# Patient Record
Sex: Male | Born: 2000 | Race: White | Hispanic: No | Marital: Single | Smoking: Never smoker
Health system: Southern US, Community
[De-identification: ages and names within clinical notes are randomized; demographics above are authoritative.]

## PROBLEM LIST (undated history)

## (undated) DIAGNOSIS — J45909 Unspecified asthma, uncomplicated: Secondary | ICD-10-CM

---
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
## 2019-03-19 ENCOUNTER — Encounter (HOSPITAL_COMMUNITY): Payer: Self-pay

## 2019-03-19 ENCOUNTER — Ambulatory Visit (HOSPITAL_COMMUNITY)
Admission: EM | Admit: 2019-03-19 | Discharge: 2019-03-19 | Disposition: A | Discharge status: Home / Self Care | Payer: Self-pay | Attending: Family Medicine | Admitting: Family Medicine

## 2019-03-19 ENCOUNTER — Other Ambulatory Visit: Payer: Self-pay

## 2019-03-19 DIAGNOSIS — H6122 Impacted cerumen, left ear: Secondary | ICD-10-CM

## 2019-03-19 HISTORY — DX: Unspecified asthma, uncomplicated: J45.909

## 2019-03-19 NOTE — ED Triage Notes (Signed)
Pt presents to UC w/ c/o hearing loss in left ear since yesterday. Pt states he feels like "something is blocking it"

## 2019-03-19 NOTE — Discharge Instructions (Signed)
May use over the counter Debrox as needed, limit/avoid use of qtips which can further compress wax within the ear.

## 2019-03-19 NOTE — ED Provider Notes (Signed)
[facility]    CSN: [id number] Arrival date & time: 03/19/19  1532      History   Chief Complaint Chief Complaint  Patient presents with   left ear pain/obstruction    HPI William Knapp is a 18 y.o. male.   William Knapp presents with complaints of left ear fullness, with a "blocked" sensation. He tried putting water and hydrogen peroxide and q-tips to clean, which did not help. Not painful necessarily but sensation of water in it like it needs to pop. No other nasal or facial congestion. No injury to the ear. No specific hearing loss. Denies any previous similar. No drainage from the ear.    ROS per HPI, negative if not otherwise mentioned.      Past Medical History:  Diagnosis Date   Asthma     There are no active problems to display for this patient.   History reviewed. No pertinent surgical history.     Home Medications    Prior to Admission medications   Not on File    Family History Family History  Problem Relation Age of Onset   Healthy Mother    Healthy Father     Social History Social History   Tobacco Use   Smoking status: Never Smoker   Smokeless tobacco: Never Used  Substance Use Topics   Alcohol use: Never    Frequency: Never   Drug use: Yes    Types: Marijuana     Allergies   Patient has no known allergies.   Review of Systems Review of Systems   Physical Exam Triage Vital Signs ED Triage Vitals  Enc Vitals Group     BP 03/19/19 1657 (!) 143/77     Pulse Rate 03/19/19 1657 68     Resp 03/19/19 1657 16     Temp 03/19/19 1657 97.8 F (36.6 C)     Temp Source 03/19/19 1657 Oral     SpO2 03/19/19 1657 100 %     Weight --      Height --      Head Circumference --      Peak Flow --      Pain Score 03/19/19 1700 0     Pain Loc --      Pain Edu? --      Excl. in [place]? --    No data found.  Updated Vital Signs BP (!) 143/77 (BP Location: Right Arm)    Pulse 68    Temp 97.8 F (36.6 C) (Oral)     Resp 16    SpO2 100%    Physical Exam Constitutional:      Appearance: He is well-developed.  HENT:     Right Ear: Tympanic membrane and ear canal normal.     Left Ear: Tympanic membrane and ear canal normal. There is impacted cerumen.     Ears:     Comments: Cerumen impaction present on initial evaluation; irrigated per nursing staff, on re-evaluation no acute findings and patient with subjective improvement.  Cardiovascular:     Rate and Rhythm: Normal rate.  Pulmonary:     Effort: Pulmonary effort is normal.  Skin:    General: Skin is warm and dry.  Neurological:     Mental Status: He is alert and oriented to person, place, and time.      UC Treatments / Results  Labs (all labs ordered are listed, but only abnormal results are displayed) Labs Reviewed - No data to  display  EKG   Radiology No results found.  Procedures Procedures (including critical care time)  Medications Ordered in UC Medications - No data to display  Initial Impression / Assessment and Plan / UC Course  I have reviewed the triage vital signs and the nursing notes.  Pertinent labs & imaging results that were available during my care of the patient were reviewed by me and considered in my medical decision making (see chart for details).     Irrigation to left ear canal per nursing staff with successful removal of cerumen. No other acute findings and patient feels improved. Cerumen management discussed. Patient verbalized understanding and agreeable to plan.   Final Clinical Impressions(s) / UC Diagnoses   Final diagnoses:  Impacted cerumen of left ear     Discharge Instructions     May use over the counter Debrox as needed, limit/avoid use of qtips which can further compress wax within the ear.     ED Prescriptions    None     PDMP not reviewed this encounter.   Georgetta Haber, NP 03/19/19 1824

## 2020-08-17 ENCOUNTER — Emergency Department (HOSPITAL_COMMUNITY): Payer: No Typology Code available for payment source

## 2020-08-17 ENCOUNTER — Other Ambulatory Visit: Payer: Self-pay

## 2020-08-17 ENCOUNTER — Inpatient Hospital Stay (HOSPITAL_COMMUNITY): Payer: No Typology Code available for payment source | Admitting: Certified Registered Nurse Anesthetist

## 2020-08-17 ENCOUNTER — Encounter (HOSPITAL_COMMUNITY): Payer: Self-pay

## 2020-08-17 ENCOUNTER — Inpatient Hospital Stay (HOSPITAL_COMMUNITY): Payer: No Typology Code available for payment source

## 2020-08-17 ENCOUNTER — Inpatient Hospital Stay (HOSPITAL_COMMUNITY)
Admission: EM | Admit: 2020-08-17 | Discharge: 2020-08-21 | DRG: 958 | Disposition: A | Discharge status: Rehabilitation Facility | Payer: No Typology Code available for payment source | Attending: General Surgery | Admitting: General Surgery

## 2020-08-17 ENCOUNTER — Encounter (HOSPITAL_COMMUNITY): Admission: EM | Disposition: A | Discharge status: Rehabilitation Facility | Payer: Self-pay | Source: Home / Self Care

## 2020-08-17 DIAGNOSIS — K449 Diaphragmatic hernia without obstruction or gangrene: Secondary | ICD-10-CM

## 2020-08-17 DIAGNOSIS — D62 Acute posthemorrhagic anemia: Secondary | ICD-10-CM | POA: Diagnosis not present

## 2020-08-17 DIAGNOSIS — S32810A Multiple fractures of pelvis with stable disruption of pelvic ring, initial encounter for closed fracture: Secondary | ICD-10-CM | POA: Diagnosis present

## 2020-08-17 DIAGNOSIS — Z20822 Contact with and (suspected) exposure to covid-19: Secondary | ICD-10-CM | POA: Diagnosis present

## 2020-08-17 DIAGNOSIS — S272XXA Traumatic hemopneumothorax, initial encounter: Secondary | ICD-10-CM | POA: Diagnosis present

## 2020-08-17 DIAGNOSIS — D696 Thrombocytopenia, unspecified: Secondary | ICD-10-CM | POA: Diagnosis not present

## 2020-08-17 DIAGNOSIS — J939 Pneumothorax, unspecified: Secondary | ICD-10-CM

## 2020-08-17 DIAGNOSIS — S36112A Contusion of liver, initial encounter: Secondary | ICD-10-CM | POA: Diagnosis present

## 2020-08-17 DIAGNOSIS — S27809A Unspecified injury of diaphragm, initial encounter: Secondary | ICD-10-CM | POA: Diagnosis present

## 2020-08-17 DIAGNOSIS — Z9109 Other allergy status, other than to drugs and biological substances: Secondary | ICD-10-CM | POA: Diagnosis not present

## 2020-08-17 DIAGNOSIS — S2242XA Multiple fractures of ribs, left side, initial encounter for closed fracture: Secondary | ICD-10-CM | POA: Diagnosis present

## 2020-08-17 DIAGNOSIS — S32119A Unspecified Zone I fracture of sacrum, initial encounter for closed fracture: Secondary | ICD-10-CM | POA: Diagnosis present

## 2020-08-17 DIAGNOSIS — J942 Hemothorax: Secondary | ICD-10-CM

## 2020-08-17 DIAGNOSIS — Y92410 Unspecified street and highway as the place of occurrence of the external cause: Secondary | ICD-10-CM

## 2020-08-17 DIAGNOSIS — R109 Unspecified abdominal pain: Secondary | ICD-10-CM | POA: Diagnosis not present

## 2020-08-17 DIAGNOSIS — K458 Other specified abdominal hernia without obstruction or gangrene: Secondary | ICD-10-CM | POA: Diagnosis present

## 2020-08-17 DIAGNOSIS — S270XXA Traumatic pneumothorax, initial encounter: Secondary | ICD-10-CM

## 2020-08-17 DIAGNOSIS — E739 Lactose intolerance, unspecified: Secondary | ICD-10-CM | POA: Diagnosis present

## 2020-08-17 DIAGNOSIS — M25562 Pain in left knee: Secondary | ICD-10-CM

## 2020-08-17 DIAGNOSIS — S3282XA Multiple fractures of pelvis without disruption of pelvic ring, initial encounter for closed fracture: Secondary | ICD-10-CM

## 2020-08-17 DIAGNOSIS — Z4682 Encounter for fitting and adjustment of non-vascular catheter: Secondary | ICD-10-CM

## 2020-08-17 DIAGNOSIS — S329XXA Fracture of unspecified parts of lumbosacral spine and pelvis, initial encounter for closed fracture: Secondary | ICD-10-CM

## 2020-08-17 DIAGNOSIS — T1490XA Injury, unspecified, initial encounter: Secondary | ICD-10-CM

## 2020-08-17 HISTORY — PX: LAPAROSCOPY: SHX197

## 2020-08-17 HISTORY — PX: ORIF PELVIC FRACTURE WITH PERCUTANEOUS SCREWS: SHX6800

## 2020-08-17 LAB — URINALYSIS, ROUTINE W REFLEX MICROSCOPIC
Bacteria, UA: NONE SEEN
Bilirubin Urine: NEGATIVE
Glucose, UA: 50 mg/dL — AB
Ketones, ur: NEGATIVE mg/dL
Nitrite: NEGATIVE
Protein, ur: 100 mg/dL — AB
RBC / HPF: >50 RBC/hpf — ABNORMAL HIGH (ref 0–5)
Specific Gravity, Urine: 1.023 (ref 1.005–1.030)
pH: 7 (ref 5.0–8.0)

## 2020-08-17 LAB — LACTIC ACID, PLASMA
Lactic Acid, Venous: 3.5 mmol/L (ref 0.5–1.9)
Lactic Acid, Venous: 7.5 mmol/L (ref 0.5–1.9)

## 2020-08-17 LAB — I-STAT CHEM 8, ED
BUN: 17 mg/dL (ref 6–20)
Calcium, Ion: 1.1 mmol/L — ABNORMAL LOW (ref 1.15–1.40)
Chloride: 101 mmol/L (ref 98–111)
Creatinine, Ser: 1 mg/dL (ref 0.61–1.24)
Glucose, Bld: 178 mg/dL — ABNORMAL HIGH (ref 70–99)
HCT: 46 % (ref 39.0–52.0)
Hemoglobin: 15.6 g/dL (ref 13.0–17.0)
Potassium: 3.3 mmol/L — ABNORMAL LOW (ref 3.5–5.1)
Sodium: 139 mmol/L (ref 135–145)
TCO2: 24 mmol/L (ref 22–32)

## 2020-08-17 LAB — SAMPLE TO BLOOD BANK

## 2020-08-17 LAB — COMPREHENSIVE METABOLIC PANEL
ALT: 250 U/L — ABNORMAL HIGH (ref 0–44)
AST: 311 U/L — ABNORMAL HIGH (ref 15–41)
Albumin: 3.9 g/dL (ref 3.5–5.0)
Alkaline Phosphatase: 101 U/L (ref 38–126)
Anion gap: 10 (ref 5–15)
BUN: 14 mg/dL (ref 6–20)
CO2: 25 mmol/L (ref 22–32)
Calcium: 8.7 mg/dL — ABNORMAL LOW (ref 8.9–10.3)
Chloride: 102 mmol/L (ref 98–111)
Creatinine, Ser: 1.15 mg/dL (ref 0.61–1.24)
GFR, Estimated: >60 mL/min (ref >60)
Glucose, Bld: 182 mg/dL — ABNORMAL HIGH (ref 70–99)
Potassium: 3.1 mmol/L — ABNORMAL LOW (ref 3.5–5.1)
Sodium: 137 mmol/L (ref 135–145)
Total Bilirubin: 0.8 mg/dL (ref 0.3–1.2)
Total Protein: 7.1 g/dL (ref 6.5–8.1)

## 2020-08-17 LAB — CBC
HCT: 45.9 % (ref 39.0–52.0)
Hemoglobin: 15.3 g/dL (ref 13.0–17.0)
MCH: 28.7 pg (ref 26.0–34.0)
MCHC: 33.3 g/dL (ref 30.0–36.0)
MCV: 86.1 fL (ref 80.0–100.0)
Platelets: 253 10*3/uL (ref 150–400)
RBC: 5.33 MIL/uL (ref 4.22–5.81)
RDW: 14 % (ref 11.5–15.5)
WBC: 19.8 10*3/uL — ABNORMAL HIGH (ref 4.0–10.5)
nRBC: 0 % (ref 0.0–0.2)

## 2020-08-17 LAB — HIV ANTIBODY (ROUTINE TESTING W REFLEX): HIV Screen 4th Generation wRfx: NONREACTIVE

## 2020-08-17 LAB — RESP PANEL BY RT-PCR (FLU A&B, COVID) ARPGX2
Influenza A by PCR: NEGATIVE
Influenza B by PCR: NEGATIVE
SARS Coronavirus 2 by RT PCR: NEGATIVE

## 2020-08-17 LAB — ETHANOL: Alcohol, Ethyl (B): <10 mg/dL (ref <10)

## 2020-08-17 LAB — PROTIME-INR
INR: 1.4 — ABNORMAL HIGH (ref 0.8–1.2)
Prothrombin Time: 16.3 seconds — ABNORMAL HIGH (ref 11.4–15.2)

## 2020-08-17 SURGERY — CLOSED REDUCTION, PELVIS, WITH PERCUTANEOUS FIXATION
Anesthesia: General

## 2020-08-17 MED ORDER — BUPIVACAINE HCL 0.25 % IJ SOLN
INTRAMUSCULAR | Status: DC | PRN
  Administered 2020-08-17: 5 mL

## 2020-08-17 MED ORDER — CHLORHEXIDINE GLUCONATE 0.12 % MT SOLN
15.0000 mL | Freq: Once | OROMUCOSAL | Status: AC
  Administered 2020-08-17: 15 mL via OROMUCOSAL
  Filled 2020-08-17: qty 15

## 2020-08-17 MED ORDER — ONDANSETRON HCL 4 MG/2ML IJ SOLN: 4.0000 mg | Freq: Four times a day (QID) | INTRAMUSCULAR | Status: DC | PRN

## 2020-08-17 MED ORDER — FENTANYL CITRATE (PF) 250 MCG/5ML IJ SOLN
INTRAMUSCULAR | Status: DC | PRN
  Administered 2020-08-17: 25 ug via INTRAVENOUS
  Administered 2020-08-17: 50 ug via INTRAVENOUS
  Administered 2020-08-17: 25 ug via INTRAVENOUS
  Administered 2020-08-17: 100 ug via INTRAVENOUS
  Administered 2020-08-17: 50 ug via INTRAVENOUS

## 2020-08-17 MED ORDER — ONDANSETRON HCL 4 MG/2ML IJ SOLN
INTRAMUSCULAR | Status: AC
  Filled 2020-08-17: qty 4

## 2020-08-17 MED ORDER — DOCUSATE SODIUM 100 MG PO CAPS
100.0000 mg | ORAL_CAPSULE | Freq: Two times a day (BID) | ORAL | Status: DC
  Administered 2020-08-17 – 2020-08-21 (×8): 100 mg via ORAL
  Filled 2020-08-17 (×8): qty 1

## 2020-08-17 MED ORDER — BUPIVACAINE HCL (PF) 0.25 % IJ SOLN
INTRAMUSCULAR | Status: AC
  Filled 2020-08-17: qty 30

## 2020-08-17 MED ORDER — IOHEXOL 300 MG/ML  SOLN
100.0000 mL | Freq: Once | INTRAMUSCULAR | Status: AC | PRN
  Administered 2020-08-17: 100 mL via INTRAVENOUS

## 2020-08-17 MED ORDER — ACETAMINOPHEN 160 MG/5ML PO SOLN: 1000.0000 mg | Freq: Once | ORAL | Status: DC | PRN

## 2020-08-17 MED ORDER — DEXAMETHASONE SODIUM PHOSPHATE 10 MG/ML IJ SOLN
INTRAMUSCULAR | Status: AC
  Filled 2020-08-17: qty 2

## 2020-08-17 MED ORDER — ONDANSETRON HCL 4 MG/2ML IJ SOLN
INTRAMUSCULAR | Status: DC | PRN
  Administered 2020-08-17: 4 mg via INTRAVENOUS

## 2020-08-17 MED ORDER — LACTATED RINGERS IV SOLN: INTRAVENOUS | Status: DC

## 2020-08-17 MED ORDER — SUGAMMADEX SODIUM 200 MG/2ML IV SOLN
INTRAVENOUS | Status: DC | PRN
  Administered 2020-08-17: 200 mg via INTRAVENOUS

## 2020-08-17 MED ORDER — ONDANSETRON HCL 4 MG PO TABS: 4.0000 mg | ORAL_TABLET | Freq: Four times a day (QID) | ORAL | Status: DC | PRN

## 2020-08-17 MED ORDER — KETAMINE HCL 50 MG/5ML IJ SOSY
15.0000 mg | PREFILLED_SYRINGE | Freq: Once | INTRAMUSCULAR | Status: AC
  Administered 2020-08-17: 15 mg via INTRAVENOUS

## 2020-08-17 MED ORDER — DEXAMETHASONE SODIUM PHOSPHATE 10 MG/ML IJ SOLN
INTRAMUSCULAR | Status: DC | PRN
  Administered 2020-08-17: 10 mg via INTRAVENOUS

## 2020-08-17 MED ORDER — ACETAMINOPHEN 10 MG/ML IV SOLN: 1000.0000 mg | Freq: Once | INTRAVENOUS | Status: DC | PRN

## 2020-08-17 MED ORDER — ENOXAPARIN SODIUM 30 MG/0.3ML ~~LOC~~ SOLN: 30.0000 mg | Freq: Two times a day (BID) | SUBCUTANEOUS | Status: DC

## 2020-08-17 MED ORDER — ROCURONIUM BROMIDE 10 MG/ML (PF) SYRINGE
PREFILLED_SYRINGE | INTRAVENOUS | Status: DC | PRN
  Administered 2020-08-17 (×2): 50 mg via INTRAVENOUS

## 2020-08-17 MED ORDER — ACETAMINOPHEN 325 MG PO TABS
650.0000 mg | ORAL_TABLET | Freq: Four times a day (QID) | ORAL | Status: DC
  Administered 2020-08-18 (×2): 650 mg via ORAL
  Filled 2020-08-17 (×2): qty 2

## 2020-08-17 MED ORDER — PHENYLEPHRINE HCL (PRESSORS) 10 MG/ML IV SOLN
INTRAVENOUS | Status: DC | PRN
  Administered 2020-08-17 (×2): 120 ug via INTRAVENOUS

## 2020-08-17 MED ORDER — ONDANSETRON 4 MG PO TBDP: 4.0000 mg | ORAL_TABLET | Freq: Four times a day (QID) | ORAL | Status: DC | PRN

## 2020-08-17 MED ORDER — METOCLOPRAMIDE HCL 5 MG/ML IJ SOLN: 5.0000 mg | Freq: Three times a day (TID) | INTRAMUSCULAR | Status: DC | PRN

## 2020-08-17 MED ORDER — SODIUM CHLORIDE 0.9 % IV SOLN: INTRAVENOUS | Status: DC

## 2020-08-17 MED ORDER — LIDOCAINE 2% (20 MG/ML) 5 ML SYRINGE
INTRAMUSCULAR | Status: AC
  Filled 2020-08-17: qty 10

## 2020-08-17 MED ORDER — EPHEDRINE 5 MG/ML INJ
INTRAVENOUS | Status: AC
  Filled 2020-08-17: qty 10

## 2020-08-17 MED ORDER — PHENYLEPHRINE 40 MCG/ML (10ML) SYRINGE FOR IV PUSH (FOR BLOOD PRESSURE SUPPORT)
PREFILLED_SYRINGE | INTRAVENOUS | Status: AC
  Filled 2020-08-17: qty 20

## 2020-08-17 MED ORDER — SUCCINYLCHOLINE CHLORIDE 200 MG/10ML IV SOSY
PREFILLED_SYRINGE | INTRAVENOUS | Status: AC
  Filled 2020-08-17: qty 20

## 2020-08-17 MED ORDER — HYDROMORPHONE HCL 1 MG/ML IJ SOLN
INTRAMUSCULAR | Status: AC
  Filled 2020-08-17: qty 0.5

## 2020-08-17 MED ORDER — DOCUSATE SODIUM 100 MG PO CAPS: 100.0000 mg | ORAL_CAPSULE | Freq: Two times a day (BID) | ORAL | Status: DC

## 2020-08-17 MED ORDER — CEFAZOLIN SODIUM-DEXTROSE 2-4 GM/100ML-% IV SOLN
2.0000 g | Freq: Three times a day (TID) | INTRAVENOUS | Status: AC
  Administered 2020-08-17 – 2020-08-18 (×3): 2 g via INTRAVENOUS
  Filled 2020-08-17 (×3): qty 100

## 2020-08-17 MED ORDER — OXYCODONE HCL 5 MG/5ML PO SOLN
5.0000 mg | Freq: Once | ORAL | Status: DC | PRN
Start: 2020-08-17 — End: 2020-08-17

## 2020-08-17 MED ORDER — LACTATED RINGERS IV SOLN
INTRAVENOUS | Status: DC
Start: 2020-08-17 — End: 2020-08-19

## 2020-08-17 MED ORDER — ACETAMINOPHEN 10 MG/ML IV SOLN
INTRAVENOUS | Status: DC | PRN
  Administered 2020-08-17: 1000 mg via INTRAVENOUS

## 2020-08-17 MED ORDER — LIDOCAINE 2% (20 MG/ML) 5 ML SYRINGE
INTRAMUSCULAR | Status: DC | PRN
  Administered 2020-08-17: 60 mg via INTRAVENOUS

## 2020-08-17 MED ORDER — DEXMEDETOMIDINE HCL 200 MCG/2ML IV SOLN
INTRAVENOUS | Status: DC | PRN
  Administered 2020-08-17 (×2): 8 ug via INTRAVENOUS

## 2020-08-17 MED ORDER — PANTOPRAZOLE SODIUM 40 MG IV SOLR
40.0000 mg | Freq: Every day | INTRAVENOUS | Status: DC
  Administered 2020-08-17: 40 mg via INTRAVENOUS
  Filled 2020-08-17 (×2): qty 40

## 2020-08-17 MED ORDER — CEFAZOLIN SODIUM-DEXTROSE 2-3 GM-%(50ML) IV SOLR
INTRAVENOUS | Status: DC | PRN
  Administered 2020-08-17: 2 g via INTRAVENOUS

## 2020-08-17 MED ORDER — CHLORHEXIDINE GLUCONATE 0.12 % MT SOLN
15.0000 mL | Freq: Once | OROMUCOSAL | Status: AC
  Filled 2020-08-17: qty 15

## 2020-08-17 MED ORDER — CHLORHEXIDINE GLUCONATE 0.12 % MT SOLN
OROMUCOSAL | Status: AC
  Administered 2020-08-17: 15 mL via OROMUCOSAL
  Filled 2020-08-17: qty 15

## 2020-08-17 MED ORDER — PHENYLEPHRINE HCL-NACL 10-0.9 MG/250ML-% IV SOLN
INTRAVENOUS | Status: DC | PRN
  Administered 2020-08-17: 50 ug/min via INTRAVENOUS

## 2020-08-17 MED ORDER — SUCCINYLCHOLINE CHLORIDE 200 MG/10ML IV SOSY
PREFILLED_SYRINGE | INTRAVENOUS | Status: DC | PRN
  Administered 2020-08-17: 100 mg via INTRAVENOUS

## 2020-08-17 MED ORDER — METOPROLOL TARTRATE 5 MG/5ML IV SOLN: 5.0000 mg | Freq: Four times a day (QID) | INTRAVENOUS | Status: DC | PRN

## 2020-08-17 MED ORDER — KETAMINE HCL 50 MG/5ML IJ SOSY
0.3000 mg/kg | PREFILLED_SYRINGE | Freq: Once | INTRAMUSCULAR | Status: AC
  Administered 2020-08-17: 15 mg via INTRAVENOUS

## 2020-08-17 MED ORDER — LACTATED RINGERS IV SOLN: INTRAVENOUS | Status: DC | PRN

## 2020-08-17 MED ORDER — OXYCODONE HCL 5 MG PO TABS
5.0000 mg | ORAL_TABLET | ORAL | Status: DC | PRN
  Administered 2020-08-19 – 2020-08-21 (×4): 5 mg via ORAL
  Filled 2020-08-17: qty 2
  Filled 2020-08-17 (×3): qty 1

## 2020-08-17 MED ORDER — OXYCODONE HCL 5 MG PO TABS
5.0000 mg | ORAL_TABLET | ORAL | Status: DC | PRN
Start: 2020-08-17 — End: 2020-08-17

## 2020-08-17 MED ORDER — LIDOCAINE 5 % EX PTCH
1.0000 | MEDICATED_PATCH | CUTANEOUS | Status: DC
  Administered 2020-08-18 – 2020-08-21 (×4): 1 via TRANSDERMAL
  Filled 2020-08-17 (×5): qty 1

## 2020-08-17 MED ORDER — METOCLOPRAMIDE HCL 10 MG PO TABS: 5.0000 mg | ORAL_TABLET | Freq: Three times a day (TID) | ORAL | Status: DC | PRN

## 2020-08-17 MED ORDER — MIDAZOLAM HCL 2 MG/2ML IJ SOLN
INTRAMUSCULAR | Status: AC
  Filled 2020-08-17: qty 2

## 2020-08-17 MED ORDER — HYDROMORPHONE HCL 1 MG/ML IJ SOLN
INTRAMUSCULAR | Status: DC | PRN
  Administered 2020-08-17: .5 mg via INTRAVENOUS

## 2020-08-17 MED ORDER — FENTANYL CITRATE (PF) 100 MCG/2ML IJ SOLN: 25.0000 ug | INTRAMUSCULAR | Status: DC | PRN

## 2020-08-17 MED ORDER — MORPHINE SULFATE (PF) 2 MG/ML IV SOLN
2.0000 mg | INTRAVENOUS | Status: DC | PRN
  Administered 2020-08-17: 4 mg via INTRAVENOUS
  Filled 2020-08-17 (×2): qty 2

## 2020-08-17 MED ORDER — ACETAMINOPHEN 500 MG PO TABS: 1000.0000 mg | ORAL_TABLET | Freq: Once | ORAL | Status: DC | PRN

## 2020-08-17 MED ORDER — METHOCARBAMOL 500 MG PO TABS: 500.0000 mg | ORAL_TABLET | Freq: Three times a day (TID) | ORAL | Status: DC

## 2020-08-17 MED ORDER — 0.9 % SODIUM CHLORIDE (POUR BTL) OPTIME
TOPICAL | Status: DC | PRN
  Administered 2020-08-17: 1000 mL

## 2020-08-17 MED ORDER — FENTANYL CITRATE (PF) 250 MCG/5ML IJ SOLN
INTRAMUSCULAR | Status: AC
  Filled 2020-08-17: qty 5

## 2020-08-17 MED ORDER — MIDAZOLAM HCL 2 MG/2ML IJ SOLN
INTRAMUSCULAR | Status: DC | PRN
  Administered 2020-08-17: 2 mg via INTRAVENOUS

## 2020-08-17 MED ORDER — SODIUM CHLORIDE 0.9 % IV BOLUS
1000.0000 mL | Freq: Once | INTRAVENOUS | Status: AC
  Administered 2020-08-17: 1000 mL via INTRAVENOUS

## 2020-08-17 MED ORDER — HYDROMORPHONE HCL 1 MG/ML IJ SOLN
0.5000 mg | INTRAMUSCULAR | Status: DC | PRN
  Administered 2020-08-18: 1 mg via INTRAVENOUS
  Filled 2020-08-17: qty 1

## 2020-08-17 MED ORDER — KETAMINE HCL 50 MG/5ML IJ SOSY
PREFILLED_SYRINGE | INTRAMUSCULAR | Status: AC
  Filled 2020-08-17: qty 5

## 2020-08-17 MED ORDER — KETAMINE HCL 10 MG/ML IJ SOLN
0.3000 mg/kg | Freq: Once | INTRAMUSCULAR | Status: DC
  Administered 2020-08-17: 15 mg via INTRAVENOUS

## 2020-08-17 MED ORDER — ALBUMIN HUMAN 5 % IV SOLN: INTRAVENOUS | Status: DC | PRN

## 2020-08-17 MED ORDER — OXYCODONE HCL 5 MG PO TABS: 5.0000 mg | ORAL_TABLET | Freq: Once | ORAL | Status: DC | PRN

## 2020-08-17 MED ORDER — METHOCARBAMOL 1000 MG/10ML IJ SOLN
500.0000 mg | Freq: Four times a day (QID) | INTRAVENOUS | Status: DC | PRN
  Filled 2020-08-17: qty 5

## 2020-08-17 MED ORDER — ACETAMINOPHEN 325 MG PO TABS: 650.0000 mg | ORAL_TABLET | ORAL | Status: DC | PRN

## 2020-08-17 MED ORDER — CEFAZOLIN SODIUM-DEXTROSE 2-4 GM/100ML-% IV SOLN
INTRAVENOUS | Status: AC
  Administered 2020-08-17: 2000 mg
  Filled 2020-08-17: qty 100

## 2020-08-17 MED ORDER — VANCOMYCIN HCL 1000 MG IV SOLR
INTRAVENOUS | Status: AC
  Filled 2020-08-17: qty 1000

## 2020-08-17 MED ORDER — PROPOFOL 10 MG/ML IV BOLUS
INTRAVENOUS | Status: AC
  Filled 2020-08-17: qty 20

## 2020-08-17 MED ORDER — PROPOFOL 10 MG/ML IV BOLUS
INTRAVENOUS | Status: DC | PRN
  Administered 2020-08-17: 60 mg via INTRAVENOUS

## 2020-08-17 MED ORDER — METHOCARBAMOL 500 MG PO TABS
500.0000 mg | ORAL_TABLET | Freq: Four times a day (QID) | ORAL | Status: DC | PRN
  Administered 2020-08-17: 500 mg via ORAL
  Filled 2020-08-17: qty 1

## 2020-08-17 MED ORDER — VASOPRESSIN 20 UNIT/ML IV SOLN
INTRAVENOUS | Status: AC
  Filled 2020-08-17: qty 1

## 2020-08-17 MED ORDER — POLYETHYLENE GLYCOL 3350 17 G PO PACK: 17.0000 g | PACK | Freq: Every day | ORAL | Status: DC | PRN

## 2020-08-17 MED ORDER — PANTOPRAZOLE SODIUM 40 MG PO TBEC
40.0000 mg | DELAYED_RELEASE_TABLET | Freq: Every day | ORAL | Status: DC
  Administered 2020-08-18 – 2020-08-21 (×4): 40 mg via ORAL
  Filled 2020-08-17 (×4): qty 1

## 2020-08-17 SURGICAL SUPPLY — 80 items
ADH SKN CLS APL DERMABOND .7 (GAUZE/BANDAGES/DRESSINGS) ×4
APL PRP STRL LF DISP 70% ISPRP (MISCELLANEOUS) ×4
BAG DECANTER FOR FLEXI CONT (MISCELLANEOUS) ×3 IMPLANT
BIOPATCH RED 1 DISK 7.0 (GAUZE/BANDAGES/DRESSINGS) ×3 IMPLANT
BIT DRILL CANN 4.5MM (BIT) ×2 IMPLANT
BLADE CLIPPER SURG (BLADE) ×3 IMPLANT
BLADE SURG 11 STRL SS (BLADE) ×3 IMPLANT
CANISTER SUCT 3000ML PPV (MISCELLANEOUS) ×6 IMPLANT
CHLORAPREP W/TINT 26 (MISCELLANEOUS) ×6 IMPLANT
COVER SURGICAL LIGHT HANDLE (MISCELLANEOUS) ×6 IMPLANT
COVER WAND RF STERILE (DRAPES) IMPLANT
DERMABOND ADVANCED (GAUZE/BANDAGES/DRESSINGS) ×2
DERMABOND ADVANCED .7 DNX12 (GAUZE/BANDAGES/DRESSINGS) ×4 IMPLANT
DRAPE C-ARM 42X72 X-RAY (DRAPES) ×3 IMPLANT
DRAPE C-ARMOR (DRAPES) ×3 IMPLANT
DRAPE HALF SHEET 40X57 (DRAPES) ×3 IMPLANT
DRAPE INCISE IOBAN 66X45 STRL (DRAPES) ×3 IMPLANT
DRAPE SURG 17X23 STRL (DRAPES) ×18 IMPLANT
DRAPE U-SHAPE 47X51 STRL (DRAPES) ×3 IMPLANT
DRAPE WARM FLUID 44X44 (DRAPES) ×3 IMPLANT
DRESSING MEPILEX FLEX 4X4 (GAUZE/BANDAGES/DRESSINGS) ×2 IMPLANT
DRILL BIT CANN 4.5MM (BIT) ×1
DRSG MEPILEX BORDER 4X4 (GAUZE/BANDAGES/DRESSINGS) IMPLANT
DRSG MEPILEX BORDER 4X8 (GAUZE/BANDAGES/DRESSINGS) IMPLANT
DRSG MEPILEX FLEX 4X4 (GAUZE/BANDAGES/DRESSINGS) ×3
DRSG TEGADERM 4X4.5 CHG (GAUZE/BANDAGES/DRESSINGS) ×3 IMPLANT
ELECT REM PT RETURN 9FT ADLT (ELECTROSURGICAL) ×3
ELECTRODE REM PT RTRN 9FT ADLT (ELECTROSURGICAL) ×2 IMPLANT
GAUZE SPONGE 2X2 8PLY STRL LF (GAUZE/BANDAGES/DRESSINGS) ×2 IMPLANT
GAUZE SPONGE 4X4 12PLY STRL (GAUZE/BANDAGES/DRESSINGS) ×3 IMPLANT
GLOVE BIO SURGEON STRL SZ 6.5 (GLOVE) ×9 IMPLANT
GLOVE BIO SURGEON STRL SZ7.5 (GLOVE) ×15 IMPLANT
GLOVE BIOGEL PI IND STRL 7.5 (GLOVE) ×2 IMPLANT
GLOVE BIOGEL PI INDICATOR 7.5 (GLOVE) ×1
GLOVE SURG UNDER POLY LF SZ6.5 (GLOVE) ×3 IMPLANT
GOWN STRL REUS W/ TWL LRG LVL3 (GOWN DISPOSABLE) ×8 IMPLANT
GOWN STRL REUS W/ TWL XL LVL3 (GOWN DISPOSABLE) ×2 IMPLANT
GOWN STRL REUS W/TWL LRG LVL3 (GOWN DISPOSABLE) ×12
GOWN STRL REUS W/TWL XL LVL3 (GOWN DISPOSABLE) ×3
GRASPER SUT TROCAR 14GX15 (MISCELLANEOUS) ×3 IMPLANT
GUIDEWIRE 2.0MM (WIRE) ×9 IMPLANT
GUIDEWIRE THREADED 2.8MM (WIRE) ×9 IMPLANT
KIT BASIN OR (CUSTOM PROCEDURE TRAY) ×3 IMPLANT
KIT TURNOVER KIT B (KITS) ×6 IMPLANT
MANIFOLD NEPTUNE II (INSTRUMENTS) ×3 IMPLANT
MARKER SKIN DUAL TIP RULER LAB (MISCELLANEOUS) ×3 IMPLANT
NEEDLE INSUFFLATION 14GA 120MM (NEEDLE) ×3 IMPLANT
NS IRRIG 1000ML POUR BTL (IV SOLUTION) ×3 IMPLANT
PACK TOTAL JOINT (CUSTOM PROCEDURE TRAY) ×3 IMPLANT
PACK UNIVERSAL I (CUSTOM PROCEDURE TRAY) ×3 IMPLANT
PAD ARMBOARD 7.5X6 YLW CONV (MISCELLANEOUS) ×12 IMPLANT
SCISSORS LAP 5X35 DISP (ENDOMECHANICALS) ×3 IMPLANT
SCREW BONE CANN 7.3X75 HEX (Screw) ×3 IMPLANT
SCREW CANN 6.5X80 HEXAGONAL (Screw) ×3 IMPLANT
SCREW CANN FT SD 6.5X85 (Screw) ×3 IMPLANT
SET IRRIG TUBING LAPAROSCOPIC (IRRIGATION / IRRIGATOR) IMPLANT
SET TUBE SMOKE EVAC HIGH FLOW (TUBING) ×3 IMPLANT
SLEEVE ENDOPATH XCEL 5M (ENDOMECHANICALS) IMPLANT
SPONGE GAUZE 2X2 STER 10/PKG (GAUZE/BANDAGES/DRESSINGS) ×1
SPONGE LAP 18X18 RF (DISPOSABLE) IMPLANT
STAPLER VISISTAT 35W (STAPLE) ×3 IMPLANT
SUCTION FRAZIER HANDLE 10FR (MISCELLANEOUS) ×3
SUCTION TUBE FRAZIER 10FR DISP (MISCELLANEOUS) ×2 IMPLANT
SUT MNCRL AB 3-0 PS2 18 (SUTURE) ×3 IMPLANT
SUT MNCRL AB 4-0 PS2 18 (SUTURE) ×3 IMPLANT
SUT MON AB 2-0 CT1 36 (SUTURE) ×3 IMPLANT
SUT PROLENE 0 CT 1 CR/8 (SUTURE) ×3 IMPLANT
SUT VICRYL 0 UR6 27IN ABS (SUTURE) ×3 IMPLANT
TAPE CLOTH SURG 4X10 WHT LF (GAUZE/BANDAGES/DRESSINGS) ×3 IMPLANT
TOWEL GREEN STERILE (TOWEL DISPOSABLE) ×3 IMPLANT
TOWEL GREEN STERILE FF (TOWEL DISPOSABLE) ×3 IMPLANT
TRAY FOLEY MTR SLVR 16FR STAT (SET/KITS/TRAYS/PACK) ×3 IMPLANT
TRAY LAPAROSCOPIC MC (CUSTOM PROCEDURE TRAY) ×3 IMPLANT
TROCAR XCEL 12X100 BLDLESS (ENDOMECHANICALS) IMPLANT
TROCAR XCEL BLUNT TIP 100MML (ENDOMECHANICALS) IMPLANT
TROCAR XCEL NON-BLD 11X100MML (ENDOMECHANICALS) ×3 IMPLANT
TROCAR XCEL NON-BLD 5MMX100MML (ENDOMECHANICALS) ×3 IMPLANT
WARMER LAPAROSCOPE (MISCELLANEOUS) ×3 IMPLANT
WASHER FOR 5.0 SCREWS (Washer) ×3 IMPLANT
WATER STERILE IRR 1000ML POUR (IV SOLUTION) IMPLANT

## 2020-08-17 NOTE — Anesthesia Preprocedure Evaluation (Signed)
Anesthesia Evaluation  Patient identified by MRN, date of birth, ID band Patient awake    Reviewed: Allergy & Precautions, NPO status , Patient's Chart, lab work & pertinent test results  Airway Mallampati: I  TM Distance: >3 FB Neck ROM: Full    Dental  (+) Dental Advisory Given, Teeth Intact   Pulmonary asthma ,  Covid-19 Nucleic Acid Test Results Lab Results      Component                Value               Date                      SARSCOV2NAA              NEGATIVE            08/17/2020             Left chest tube   + decreased breath sounds      Cardiovascular  Rhythm:Regular     Neuro/Psych negative neurological ROS  negative psych ROS   GI/Hepatic negative GI ROS, Neg liver ROS,   Endo/Other  negative endocrine ROS  Renal/GU negative Renal ROS     Musculoskeletal   Abdominal   Peds  Hematology negative hematology ROS (+) Lab Results      Component                Value               Date                      WBC                      19.8 (H)            08/17/2020                HGB                      15.6                08/17/2020                HCT                      46.0                08/17/2020                MCV                      86.1                08/17/2020                PLT                      253                 08/17/2020              Anesthesia Other Findings L PTX L 1, 3-6 Rib fx L traumatic diaphragm repair B inf/sup pubic rami fx R hepatic ctx  Reproductive/Obstetrics  Anesthesia Physical Anesthesia Plan  ASA: I  Anesthesia Plan: General   Post-op Pain Management:    Induction: Intravenous  PONV Risk Score and Plan: 2 and Ondansetron and Dexamethasone  Airway Management Planned: Oral ETT  Additional Equipment: None  Intra-op Plan:   Post-operative Plan: Extubation in OR  Informed Consent: I have reviewed the  patients History and Physical, chart, labs and discussed the procedure including the risks, benefits and alternatives for the proposed anesthesia with the patient or authorized representative who has indicated his/her understanding and acceptance.     Dental advisory given  Plan Discussed with: CRNA and Surgeon  Anesthesia Plan Comments:         Anesthesia Quick Evaluation
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
## 2020-08-17 NOTE — Anesthesia Postprocedure Evaluation (Signed)
Anesthesia Post Note  Patient: William Knapp  Procedure(s) Performed: ORIF PELVIC FRACTURE WITH PERCUTANEOUS SCREWS (Bilateral ) LAPAROSCOPY DIAGNOSTIC with repair of diaphragm (N/A )     Patient location during evaluation: PACU Anesthesia Type: General Level of consciousness: patient cooperative and awake Pain management: pain level controlled Vital Signs Assessment: post-procedure vital signs reviewed and stable Respiratory status: spontaneous breathing, nonlabored ventilation, respiratory function stable and patient connected to nasal cannula oxygen Cardiovascular status: blood pressure returned to baseline and stable Postop Assessment: no apparent nausea or vomiting Anesthetic complications: no   No complications documented.  Last Vitals:  Vitals:   08/17/20 1850 08/17/20 1916  BP: 129/80 139/74  Pulse: 91 94  Resp: 17 19  Temp: 36.6 C (!) 36.4 C  SpO2: 100% 100%    Last Pain:  Vitals:   08/17/20 1916  TempSrc:   PainSc: 7                  Chidera Thivierge
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
## 2020-08-17 NOTE — Progress Notes (Signed)
   08/17/20 1028  Clinical Encounter Type  Visited With Patient not available  Visit Type Trauma  Referral From Nurse  Consult/Referral To Chaplain  Chaplain responded the page.  The patient is being treated and there is no family present at this time. Chaplain remains available if needed. This note was prepared by Deneen Harts, M.Div..  For questions please contact by phone [phone].

## 2020-08-17 NOTE — OR Nursing (Signed)
Patient transported to OR room with intact and patent chest tube from left chest wall
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
## 2020-08-17 NOTE — Anesthesia Procedure Notes (Signed)
Procedure Name: Intubation Date/Time: 08/17/2020 3:40 PM Performed by: Clearnce Sorrel, CRNA Pre-anesthesia Checklist: Patient identified, Emergency Drugs available, Suction available, Patient being monitored and Timeout performed Patient Re-evaluated:Patient Re-evaluated prior to induction Oxygen Delivery Method: Circle system utilized Preoxygenation: Pre-oxygenation with 100% oxygen Induction Type: Rapid sequence, Cricoid Pressure applied and IV induction Laryngoscope Size: Mac and 4 Grade View: Grade I Tube type: Oral Tube size: 7.5 mm Number of attempts: 1 Airway Equipment and Method: Stylet Placement Confirmation: ETT inserted through vocal cords under direct vision,  positive ETCO2 and breath sounds checked- equal and bilateral Secured at: 23 cm Tube secured with: Tape Dental Injury: Teeth and Oropharynx as per pre-operative assessment

## 2020-08-17 NOTE — Transfer of Care (Signed)
Immediate Anesthesia Transfer of Care Note  Patient: William Knapp  Procedure(s) Performed: ORIF PELVIC FRACTURE WITH PERCUTANEOUS SCREWS (Bilateral ) LAPAROSCOPY DIAGNOSTIC with repair of diaphragm (N/A )  Patient Location: PACU  Anesthesia Type:General  Level of Consciousness: drowsy  Airway & Oxygen Therapy: Patient Spontanous Breathing and Patient connected to face mask oxygen  Post-op Assessment: Report given to RN and Post -op Vital signs reviewed and stable  Post vital signs: Reviewed and stable  Last Vitals:  Vitals Value Taken Time  BP 140/60 08/17/20 1816  Temp    Pulse 86 08/17/20 1820  Resp 16 08/17/20 1820  SpO2 100 % 08/17/20 1820  Vitals shown include unvalidated device data.  Last Pain:  Vitals:   08/17/20 1157  TempSrc:   PainSc: 10-Worst pain ever         Complications: No complications documented.
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
## 2020-08-18 ENCOUNTER — Inpatient Hospital Stay (HOSPITAL_COMMUNITY): Payer: No Typology Code available for payment source

## 2020-08-18 ENCOUNTER — Encounter (HOSPITAL_COMMUNITY): Payer: Self-pay | Admitting: Student

## 2020-08-18 LAB — BASIC METABOLIC PANEL
Anion gap: 9 (ref 5–15)
BUN: 11 mg/dL (ref 6–20)
CO2: 26 mmol/L (ref 22–32)
Calcium: 8.2 mg/dL — ABNORMAL LOW (ref 8.9–10.3)
Chloride: 102 mmol/L (ref 98–111)
Creatinine, Ser: 0.88 mg/dL (ref 0.61–1.24)
GFR, Estimated: >60 mL/min (ref >60)
Glucose, Bld: 141 mg/dL — ABNORMAL HIGH (ref 70–99)
Potassium: 4.7 mmol/L (ref 3.5–5.1)
Sodium: 137 mmol/L (ref 135–145)

## 2020-08-18 LAB — VITAMIN D 25 HYDROXY (VIT D DEFICIENCY, FRACTURES): Vit D, 25-Hydroxy: 20.7 ng/mL — ABNORMAL LOW (ref 30–100)

## 2020-08-18 LAB — CBC
HCT: 27.9 % — ABNORMAL LOW (ref 39.0–52.0)
Hemoglobin: 9.6 g/dL — ABNORMAL LOW (ref 13.0–17.0)
MCH: 29.2 pg (ref 26.0–34.0)
MCHC: 34.4 g/dL (ref 30.0–36.0)
MCV: 84.8 fL (ref 80.0–100.0)
Platelets: 118 10*3/uL — ABNORMAL LOW (ref 150–400)
RBC: 3.29 MIL/uL — ABNORMAL LOW (ref 4.22–5.81)
RDW: 14.1 % (ref 11.5–15.5)
WBC: 11.2 10*3/uL — ABNORMAL HIGH (ref 4.0–10.5)
nRBC: 0 % (ref 0.0–0.2)

## 2020-08-18 MED ORDER — METHOCARBAMOL 500 MG PO TABS
500.0000 mg | ORAL_TABLET | Freq: Four times a day (QID) | ORAL | Status: DC
  Administered 2020-08-18 – 2020-08-21 (×10): 500 mg via ORAL
  Filled 2020-08-18 (×12): qty 1

## 2020-08-18 MED ORDER — ACETAMINOPHEN 500 MG PO TABS
1000.0000 mg | ORAL_TABLET | Freq: Four times a day (QID) | ORAL | Status: DC
  Administered 2020-08-18 – 2020-08-21 (×8): 1000 mg via ORAL
  Filled 2020-08-18 (×13): qty 2
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
## 2020-08-18 NOTE — Progress Notes (Signed)
Inpatient Rehab Admissions Coordinator Note:   Per therapy recommendations, pt was screened for CIR candidacy by Estill Dooms, PT, DPT.  At this time we are recommending a CIR consult and I will place an order per our protocol.  Please contact me with questions.   Estill Dooms, PT, DPT [phone] 08/18/20 12:18 PM
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
## 2020-08-18 NOTE — TOC CAGE-AID Note (Signed)
Transition of Care [facility]) - CAGE-AID Screening   Patient Details  Name: William Knapp MRN: [id number] Date of Birth: 11/08/2000   Hewitt Shorts, RN Trauma Response Nurse Phone Number: 08/18/2020, 9:50 AM    CAGE-AID Screening:    Have You Ever Felt You Ought to Cut Down on Your Drinking or Drug Use?: No Have People Annoyed You By [employer] Your Drinking Or Drug Use?: No Have You Felt Bad Or Guilty About Your Drinking Or Drug Use?: No Have You Ever Had a Drink or Used Drugs First Thing In The Morning to Steady Your Nerves or to Get Rid of a Hangover?: No CAGE-AID Score: 0  Substance Abuse Education Offered: No (states "I drink one drink a night before bed" denies drug use)

## 2020-08-18 NOTE — Progress Notes (Signed)
Orthopaedic Trauma Progress Note  SUBJECTIVE: Reports mild pain about operative site, much improved from pre-op. No chest pain. No SOB. No nausea/vomiting. No other complaints. Asking about when foley catheter can be removed  OBJECTIVE:  Vitals:   08/17/20 2303 08/18/20 0258  BP: 140/78 125/69  Pulse: 90 98  Resp: 17 (!) 23  Temp: 98.3 F (36.8 C)   SpO2: 99% 99%    General: Sitting up in bed, NAD Respiratory: No increased work of breathing.  Pelvis/LLE: Dressing CDI. Scrotal edema. Tender with palpation over hips as expected ankle. Dorsiflexion/plantarflexion intact bilaterally. Sensation intact to light touch over dorsal and plantar surface of bilateral feet. 2+ DP bilaterally  IMAGING: Stable post op imaging.   LABS:  Results for orders placed or performed during the hospital encounter of 08/17/20 (from the past 24 hour(s))  Sample to Blood Bank     Status: None   Collection Time: 08/17/20 10:38 AM  Result Value Ref Range   Blood Bank Specimen SAMPLE AVAILABLE FOR TESTING    Sample Expiration      08/18/2020,2359 Performed at [facility], [address], [place], [place] [address]   Resp Panel by RT-PCR (Flu A&B, Covid) Nasopharyngeal Swab     Status: None   Collection Time: 08/17/20 10:39 AM   Specimen: Nasopharyngeal Swab; Nasopharyngeal(NP) swabs in vial transport medium  Result Value Ref Range   SARS Coronavirus 2 by RT PCR NEGATIVE NEGATIVE   Influenza A by PCR NEGATIVE NEGATIVE   Influenza B by PCR NEGATIVE NEGATIVE  Comprehensive metabolic panel     Status: Abnormal   Collection Time: 08/17/20 10:39 AM  Result Value Ref Range   Sodium 137 135 - 145 mmol/L   Potassium 3.1 (L) 3.5 - 5.1 mmol/L   Chloride 102 98 - 111 mmol/L   CO2 25 22 - 32 mmol/L   Glucose, Bld 182 (H) 70 - 99 mg/dL   BUN 14 6 - 20 mg/dL   Creatinine, Ser 4.48 0.61 - 1.24 mg/dL   Calcium 8.7 (L) 8.9 - 10.3 mg/dL   Total Protein 7.1 6.5 - 8.1 g/dL   Albumin 3.9 3.5 - 5.0 g/dL   AST  185 (H) 15 - 41 U/L   ALT 250 (H) 0 - 44 U/L   Alkaline Phosphatase 101 38 - 126 U/L   Total Bilirubin 0.8 0.3 - 1.2 mg/dL   GFR, Estimated >63 >14 mL/min   Anion gap 10 5 - 15  CBC     Status: Abnormal   Collection Time: 08/17/20 10:39 AM  Result Value Ref Range   WBC 19.8 (H) 4.0 - 10.5 K/uL   RBC 5.33 4.22 - 5.81 MIL/uL   Hemoglobin 15.3 13.0 - 17.0 g/dL   HCT 97.0 26.3 - 78.5 %   MCV 86.1 80.0 - 100.0 fL   MCH 28.7 26.0 - 34.0 pg   MCHC 33.3 30.0 - 36.0 g/dL   RDW 88.5 02.7 - 74.1 %   Platelets 253 150 - 400 K/uL   nRBC 0.0 0.0 - 0.2 %  Ethanol     Status: None   Collection Time: 08/17/20 10:39 AM  Result Value Ref Range   Alcohol, Ethyl (B) <10 <10 mg/dL  Lactic acid, plasma     Status: Abnormal   Collection Time: 08/17/20 10:39 AM  Result Value Ref Range   Lactic Acid, Venous 3.5 (HH) 0.5 - 1.9 mmol/L  Protime-INR     Status: Abnormal   Collection Time: 08/17/20  10:39 AM  Result Value Ref Range   Prothrombin Time 16.3 (H) 11.4 - 15.2 seconds   INR 1.4 (H) 0.8 - 1.2  I-Stat Chem 8, ED     Status: Abnormal   Collection Time: 08/17/20 10:47 AM  Result Value Ref Range   Sodium 139 135 - 145 mmol/L   Potassium 3.3 (L) 3.5 - 5.1 mmol/L   Chloride 101 98 - 111 mmol/L   BUN 17 6 - 20 mg/dL   Creatinine, Ser 3.84 0.61 - 1.24 mg/dL   Glucose, Bld 665 (H) 70 - 99 mg/dL   Calcium, Ion 9.93 (L) 1.15 - 1.40 mmol/L   TCO2 24 22 - 32 mmol/L   Hemoglobin 15.6 13.0 - 17.0 g/dL   HCT 57.0 17.7 - 93.9 %  Urinalysis, Routine w reflex microscopic Urine, Clean Catch     Status: Abnormal   Collection Time: 08/17/20 12:05 PM  Result Value Ref Range   Color, Urine AMBER (A) YELLOW   APPearance HAZY (A) CLEAR   Specific Gravity, Urine 1.023 1.005 - 1.030   pH 7.0 5.0 - 8.0   Glucose, UA 50 (A) NEGATIVE mg/dL   Hgb urine dipstick LARGE (A) NEGATIVE   Bilirubin Urine NEGATIVE NEGATIVE   Ketones, ur NEGATIVE NEGATIVE mg/dL   Protein, ur 030 (A) NEGATIVE mg/dL   Nitrite NEGATIVE  NEGATIVE   Leukocytes,Ua TRACE (A) NEGATIVE   RBC / HPF >50 (H) 0 - 5 RBC/hpf   WBC, UA 6-10 0 - 5 WBC/hpf   Bacteria, UA NONE SEEN NONE SEEN  HIV Antibody (routine testing w rflx)     Status: None   Collection Time: 08/17/20  7:28 PM  Result Value Ref Range   HIV Screen 4th Generation wRfx Non Reactive Non Reactive  Lactic acid, plasma     Status: Abnormal   Collection Time: 08/17/20  7:28 PM  Result Value Ref Range   Lactic Acid, Venous 7.5 (HH) 0.5 - 1.9 mmol/L  Basic metabolic panel     Status: Abnormal   Collection Time: 08/18/20  4:36 AM  Result Value Ref Range   Sodium 137 135 - 145 mmol/L   Potassium 4.7 3.5 - 5.1 mmol/L   Chloride 102 98 - 111 mmol/L   CO2 26 22 - 32 mmol/L   Glucose, Bld 141 (H) 70 - 99 mg/dL   BUN 11 6 - 20 mg/dL   Creatinine, Ser 0.92 0.61 - 1.24 mg/dL   Calcium 8.2 (L) 8.9 - 10.3 mg/dL   GFR, Estimated >33 >00 mL/min   Anion gap 9 5 - 15  CBC     Status: Abnormal   Collection Time: 08/18/20  4:36 AM  Result Value Ref Range   WBC 11.2 (H) 4.0 - 10.5 K/uL   RBC 3.29 (L) 4.22 - 5.81 MIL/uL   Hemoglobin 9.6 (L) 13.0 - 17.0 g/dL   HCT 76.2 (L) 26.3 - 33.5 %   MCV 84.8 80.0 - 100.0 fL   MCH 29.2 26.0 - 34.0 pg   MCHC 34.4 30.0 - 36.0 g/dL   RDW 45.6 25.6 - 38.9 %   Platelets 118 (L) 150 - 400 K/uL   nRBC 0.0 0.0 - 0.2 %    ASSESSMENT: William Knapp is a 20 y.o. male, 1 Day Post-Op s/p ORIF PELVIC FRACTURE WITH PERCUTANEOUS SCREWS   CV/Blood loss: Acute blood loss anemia, Hgb 9.6, down from 15.6 pre-op. Hemodynamically stable  PLAN: Weightbearing: TDWB LLE , WBAT RLE Incisional and dressing  care: Plan to remove dressings tomorrow Showering: Ok to begin showering with assistance 08/20/20 Orthopedic device(s): None  Pain management:  1. Tylenol 650 mg q 6 hours scheduled 2. Robaxin 500 mg q 6 hours PRN 3. Oxycodone 5-10 mg q 4 hours PRN 4. Dilaudid 0.5-1 mg q 4 hours PRN 5. Lidoderm patch 5% q 24 hours VTE prophylaxis:  Lovenox starting tomorrow if Hgb stable, SCDs ID: Ancef 2gm post op Foley/Lines: Foley in place, remove today if able. Continue IVFs per trauma team Impediments to Fracture Healing: Vit D level pending, will start supplementation as indicated Dispo: PT/OT eval today Follow - up plan: 2 weeks after d/c for repeat x-rays  Contact information:  William Merle MD, William Southward PA-C. After hours and holidays please check [email or web address] for group call information for [facility]   William Yepiz A. Michaelyn Barter, PA-C [phone] (office) [email or web address]

## 2020-08-18 NOTE — Progress Notes (Signed)
Notified by RN that something fell out of chest tube. Patient oxygenating fine and does not appear to be in respiratory distress. I came and evaluated and pleurivac was connected to stylette rather than chest tube. I reconnected suction set up to chest tube and removed stylette. Patient with some initial pain and tidaling in chamber but this quickly improved.  Repeat CXR in AM.  Juliet Rude, [facility] 08/18/2020, 2:50 PM Please see Amion for pager number during day hours 7:00am-4:30pm
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
## 2020-08-19 ENCOUNTER — Inpatient Hospital Stay (HOSPITAL_COMMUNITY): Payer: No Typology Code available for payment source

## 2020-08-19 LAB — COMPREHENSIVE METABOLIC PANEL
ALT: 103 U/L — ABNORMAL HIGH (ref 0–44)
AST: 125 U/L — ABNORMAL HIGH (ref 15–41)
Albumin: 3.4 g/dL — ABNORMAL LOW (ref 3.5–5.0)
Alkaline Phosphatase: 54 U/L (ref 38–126)
Anion gap: 5 (ref 5–15)
BUN: 9 mg/dL (ref 6–20)
CO2: 26 mmol/L (ref 22–32)
Calcium: 8.4 mg/dL — ABNORMAL LOW (ref 8.9–10.3)
Chloride: 106 mmol/L (ref 98–111)
Creatinine, Ser: 0.75 mg/dL (ref 0.61–1.24)
GFR, Estimated: >60 mL/min (ref >60)
Glucose, Bld: 102 mg/dL — ABNORMAL HIGH (ref 70–99)
Potassium: 4.1 mmol/L (ref 3.5–5.1)
Sodium: 137 mmol/L (ref 135–145)
Total Bilirubin: 1 mg/dL (ref 0.3–1.2)
Total Protein: 5.8 g/dL — ABNORMAL LOW (ref 6.5–8.1)

## 2020-08-19 LAB — CBC
HCT: 26.9 % — ABNORMAL LOW (ref 39.0–52.0)
HCT: 27.9 % — ABNORMAL LOW (ref 39.0–52.0)
Hemoglobin: 8.9 g/dL — ABNORMAL LOW (ref 13.0–17.0)
Hemoglobin: 9.2 g/dL — ABNORMAL LOW (ref 13.0–17.0)
MCH: 28.6 pg (ref 26.0–34.0)
MCH: 28.5 pg (ref 26.0–34.0)
MCHC: 33.1 g/dL (ref 30.0–36.0)
MCHC: 33 g/dL (ref 30.0–36.0)
MCV: 86.5 fL (ref 80.0–100.0)
MCV: 86.4 fL (ref 80.0–100.0)
Platelets: 93 10*3/uL — ABNORMAL LOW (ref 150–400)
Platelets: 93 10*3/uL — ABNORMAL LOW (ref 150–400)
RBC: 3.11 MIL/uL — ABNORMAL LOW (ref 4.22–5.81)
RBC: 3.23 MIL/uL — ABNORMAL LOW (ref 4.22–5.81)
RDW: 14.6 % (ref 11.5–15.5)
RDW: 14.5 % (ref 11.5–15.5)
WBC: 8.3 10*3/uL (ref 4.0–10.5)
WBC: 8 10*3/uL (ref 4.0–10.5)
nRBC: 0 % (ref 0.0–0.2)
nRBC: 0 % (ref 0.0–0.2)

## 2020-08-19 LAB — POCT I-STAT 7, (LYTES, BLD GAS, ICA,H+H)
Acid-base deficit: 8 mmol/L — ABNORMAL HIGH (ref 0.0–2.0)
Bicarbonate: 21 mmol/L (ref 20.0–28.0)
Calcium, Ion: 1.15 mmol/L (ref 1.15–1.40)
HCT: 31 % — ABNORMAL LOW (ref 39.0–52.0)
Hemoglobin: 10.5 g/dL — ABNORMAL LOW (ref 13.0–17.0)
O2 Saturation: 84 %
Patient temperature: 36.2
Potassium: 4.1 mmol/L (ref 3.5–5.1)
Sodium: 140 mmol/L (ref 135–145)
TCO2: 23 mmol/L (ref 22–32)
pCO2 arterial: 57.4 mmHg — ABNORMAL HIGH (ref 32.0–48.0)
pH, Arterial: 7.166 — CL (ref 7.350–7.450)
pO2, Arterial: 59 mmHg — ABNORMAL LOW (ref 83.0–108.0)

## 2020-08-19 MED ORDER — SODIUM CHLORIDE 0.9% FLUSH: 3.0000 mL | INTRAVENOUS | Status: DC | PRN

## 2020-08-19 MED ORDER — VITAMIN D 25 MCG (1000 UNIT) PO TABS
1000.0000 [IU] | ORAL_TABLET | Freq: Every day | ORAL | Status: DC
  Administered 2020-08-19 – 2020-08-21 (×3): 1000 [IU] via ORAL
  Filled 2020-08-19 (×3): qty 1

## 2020-08-19 MED ORDER — SODIUM CHLORIDE 0.9 % IV SOLN: 250.0000 mL | INTRAVENOUS | Status: DC | PRN

## 2020-08-19 MED ORDER — SODIUM CHLORIDE 0.9% FLUSH
3.0000 mL | Freq: Two times a day (BID) | INTRAVENOUS | Status: DC
  Administered 2020-08-19 – 2020-08-21 (×5): 3 mL via INTRAVENOUS
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
## 2020-08-19 NOTE — Plan of Care (Signed)

## 2020-08-19 NOTE — Progress Notes (Signed)
Inpatient Rehab Admissions:  Inpatient Rehab Consult received.  I met with patient at the bedside for rehabilitation assessment and to discuss goals and expectations of an inpatient rehab admission.  Affect is very flat, almost withdrawn.  Notes pain 4/10.  Chest tube to WS.  Pt agreeable to CIR, if needed.  Plan to d/c home to grandmother's house with dad and uncle providing 24/7 assist, if needed.  Details a little inconsistent as pt tells me family has lived here for many years, but per [facility] note family is on the way from [place].  Pt already mobilizing fairly well so we did discuss that once CT is out he may progress to not needing CIR.  Will continue to follow for now.    Signed: Shann Medal, PT, DPT Admissions Coordinator [phone] 08/19/20  2:49 PM

## 2020-08-20 ENCOUNTER — Inpatient Hospital Stay (HOSPITAL_COMMUNITY): Payer: No Typology Code available for payment source

## 2020-08-20 LAB — CBC
HCT: 28.5 % — ABNORMAL LOW (ref 39.0–52.0)
Hemoglobin: 9.6 g/dL — ABNORMAL LOW (ref 13.0–17.0)
MCH: 28.9 pg (ref 26.0–34.0)
MCHC: 33.7 g/dL (ref 30.0–36.0)
MCV: 85.8 fL (ref 80.0–100.0)
Platelets: 107 10*3/uL — ABNORMAL LOW (ref 150–400)
RBC: 3.32 MIL/uL — ABNORMAL LOW (ref 4.22–5.81)
RDW: 13.8 % (ref 11.5–15.5)
WBC: 6 10*3/uL (ref 4.0–10.5)
nRBC: 0 % (ref 0.0–0.2)

## 2020-08-20 MED ORDER — ENOXAPARIN SODIUM 30 MG/0.3ML ~~LOC~~ SOLN
30.0000 mg | Freq: Two times a day (BID) | SUBCUTANEOUS | Status: DC
  Administered 2020-08-20 – 2020-08-21 (×3): 30 mg via SUBCUTANEOUS
  Filled 2020-08-20 (×3): qty 0.3

## 2020-08-20 MED ORDER — HYDROMORPHONE HCL 1 MG/ML IJ SOLN: 0.5000 mg | Freq: Four times a day (QID) | INTRAMUSCULAR | Status: DC | PRN
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
## 2020-08-20 NOTE — Progress Notes (Signed)
Inpatient Rehab Admissions Coordinator:   Met with pt at bedside to provide estimated cost of care for CIR.  Pt in agreement and still would like to pursue CIR.  Note plans to d/c chest tube today and f/u cxr in the AM.  Will follow for possible admission pending results of cxr.   Shann Medal, PT, DPT Admissions Coordinator [phone] 08/20/20  12:14 PM
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
## 2020-08-20 NOTE — Progress Notes (Signed)
Trixie Deis, PA aware of chest xray results post chest tube removal. PA instructed nurse to have patient perform incentive spirometry overnight. This RN educated patient on the importance of performing IS overnight tonight.

## 2020-08-21 ENCOUNTER — Encounter (HOSPITAL_COMMUNITY): Payer: Self-pay

## 2020-08-21 ENCOUNTER — Inpatient Hospital Stay (HOSPITAL_COMMUNITY)
Admission: RE | Admit: 2020-08-21 | Discharge: 2020-08-28 | DRG: 560 | Disposition: A | Discharge status: Home / Self Care | Payer: Self-pay | Source: Intra-hospital | Attending: Physical Medicine and Rehabilitation | Admitting: Physical Medicine and Rehabilitation

## 2020-08-21 ENCOUNTER — Other Ambulatory Visit: Payer: Self-pay

## 2020-08-21 ENCOUNTER — Inpatient Hospital Stay (HOSPITAL_COMMUNITY): Payer: No Typology Code available for payment source

## 2020-08-21 ENCOUNTER — Encounter (HOSPITAL_COMMUNITY): Payer: Self-pay | Admitting: Physical Medicine and Rehabilitation

## 2020-08-21 DIAGNOSIS — D62 Acute posthemorrhagic anemia: Secondary | ICD-10-CM | POA: Diagnosis present

## 2020-08-21 DIAGNOSIS — R5383 Other fatigue: Secondary | ICD-10-CM | POA: Diagnosis present

## 2020-08-21 DIAGNOSIS — K449 Diaphragmatic hernia without obstruction or gangrene: Secondary | ICD-10-CM

## 2020-08-21 DIAGNOSIS — S2239XD Fracture of one rib, unspecified side, subsequent encounter for fracture with routine healing: Secondary | ICD-10-CM

## 2020-08-21 DIAGNOSIS — Z91011 Allergy to milk products: Secondary | ICD-10-CM | POA: Diagnosis not present

## 2020-08-21 DIAGNOSIS — S32810D Multiple fractures of pelvis with stable disruption of pelvic ring, subsequent encounter for fracture with routine healing: Principal | ICD-10-CM

## 2020-08-21 DIAGNOSIS — S3210XD Unspecified fracture of sacrum, subsequent encounter for fracture with routine healing: Secondary | ICD-10-CM | POA: Diagnosis not present

## 2020-08-21 DIAGNOSIS — R7401 Elevation of levels of liver transaminase levels: Secondary | ICD-10-CM | POA: Diagnosis present

## 2020-08-21 DIAGNOSIS — S36112D Contusion of liver, subsequent encounter: Secondary | ICD-10-CM

## 2020-08-21 DIAGNOSIS — S270XXA Traumatic pneumothorax, initial encounter: Secondary | ICD-10-CM | POA: Diagnosis present

## 2020-08-21 DIAGNOSIS — D696 Thrombocytopenia, unspecified: Secondary | ICD-10-CM | POA: Diagnosis present

## 2020-08-21 DIAGNOSIS — R269 Unspecified abnormalities of gait and mobility: Secondary | ICD-10-CM | POA: Diagnosis present

## 2020-08-21 DIAGNOSIS — T1490XA Injury, unspecified, initial encounter: Secondary | ICD-10-CM | POA: Diagnosis present

## 2020-08-21 DIAGNOSIS — S32810A Multiple fractures of pelvis with stable disruption of pelvic ring, initial encounter for closed fracture: Secondary | ICD-10-CM | POA: Diagnosis present

## 2020-08-21 DIAGNOSIS — S270XXD Traumatic pneumothorax, subsequent encounter: Secondary | ICD-10-CM | POA: Diagnosis not present

## 2020-08-21 DIAGNOSIS — Z888 Allergy status to other drugs, medicaments and biological substances status: Secondary | ICD-10-CM | POA: Diagnosis not present

## 2020-08-21 LAB — CBC
HCT: 28.7 % — ABNORMAL LOW (ref 39.0–52.0)
Hemoglobin: 9.8 g/dL — ABNORMAL LOW (ref 13.0–17.0)
MCH: 28.9 pg (ref 26.0–34.0)
MCHC: 34.1 g/dL (ref 30.0–36.0)
MCV: 84.7 fL (ref 80.0–100.0)
Platelets: 147 10*3/uL — ABNORMAL LOW (ref 150–400)
RBC: 3.39 MIL/uL — ABNORMAL LOW (ref 4.22–5.81)
RDW: 13.6 % (ref 11.5–15.5)
WBC: 6.3 10*3/uL (ref 4.0–10.5)
nRBC: 0 % (ref 0.0–0.2)

## 2020-08-21 MED ORDER — VITAMIN D 25 MCG (1000 UNIT) PO TABS
1000.0000 [IU] | ORAL_TABLET | Freq: Every day | ORAL | Status: DC
  Administered 2020-08-22 – 2020-08-28 (×7): 1000 [IU] via ORAL
  Filled 2020-08-21 (×7): qty 1

## 2020-08-21 MED ORDER — DOCUSATE SODIUM 100 MG PO CAPS
100.0000 mg | ORAL_CAPSULE | Freq: Two times a day (BID) | ORAL | Status: DC
  Administered 2020-08-21 – 2020-08-28 (×14): 100 mg via ORAL
  Filled 2020-08-21 (×14): qty 1

## 2020-08-21 MED ORDER — TRAZODONE HCL 50 MG PO TABS
25.0000 mg | ORAL_TABLET | Freq: Every evening | ORAL | Status: DC | PRN
  Administered 2020-08-26: 50 mg via ORAL
  Filled 2020-08-21 (×2): qty 1

## 2020-08-21 MED ORDER — PROCHLORPERAZINE MALEATE 5 MG PO TABS: 5.0000 mg | ORAL_TABLET | Freq: Four times a day (QID) | ORAL | Status: DC | PRN

## 2020-08-21 MED ORDER — POLYETHYLENE GLYCOL 3350 17 G PO PACK: 17.0000 g | PACK | Freq: Every day | ORAL | Status: DC | PRN

## 2020-08-21 MED ORDER — PROCHLORPERAZINE 25 MG RE SUPP: 12.5000 mg | Freq: Four times a day (QID) | RECTAL | Status: DC | PRN

## 2020-08-21 MED ORDER — ACETAMINOPHEN 325 MG PO TABS
650.0000 mg | ORAL_TABLET | Freq: Four times a day (QID) | ORAL | Status: DC
  Administered 2020-08-22 – 2020-08-28 (×25): 650 mg via ORAL
  Filled 2020-08-21 (×25): qty 2

## 2020-08-21 MED ORDER — ENOXAPARIN SODIUM 40 MG/0.4ML ~~LOC~~ SOLN
40.0000 mg | SUBCUTANEOUS | Status: DC
  Administered 2020-08-22 – 2020-08-26 (×5): 40 mg via SUBCUTANEOUS
  Filled 2020-08-21 (×5): qty 0.4

## 2020-08-21 MED ORDER — ALUM & MAG HYDROXIDE-SIMETH 200-200-20 MG/5ML PO SUSP: 30.0000 mL | ORAL | Status: DC | PRN

## 2020-08-21 MED ORDER — PANTOPRAZOLE SODIUM 40 MG PO TBEC
40.0000 mg | DELAYED_RELEASE_TABLET | Freq: Every day | ORAL | Status: DC
  Administered 2020-08-22 – 2020-08-28 (×7): 40 mg via ORAL
  Filled 2020-08-21 (×8): qty 1

## 2020-08-21 MED ORDER — PROCHLORPERAZINE EDISYLATE 10 MG/2ML IJ SOLN: 5.0000 mg | Freq: Four times a day (QID) | INTRAMUSCULAR | Status: DC | PRN

## 2020-08-21 MED ORDER — DIPHENHYDRAMINE HCL 12.5 MG/5ML PO ELIX: 12.5000 mg | ORAL_SOLUTION | Freq: Four times a day (QID) | ORAL | Status: DC | PRN

## 2020-08-21 MED ORDER — BISACODYL 10 MG RE SUPP: 10.0000 mg | Freq: Every day | RECTAL | Status: DC | PRN

## 2020-08-21 MED ORDER — GUAIFENESIN-DM 100-10 MG/5ML PO SYRP
5.0000 mL | ORAL_SOLUTION | Freq: Four times a day (QID) | ORAL | Status: DC | PRN
  Administered 2020-08-23: 10 mL via ORAL
  Filled 2020-08-21 (×2): qty 10

## 2020-08-21 MED ORDER — METHOCARBAMOL 500 MG PO TABS
500.0000 mg | ORAL_TABLET | Freq: Four times a day (QID) | ORAL | Status: DC
  Administered 2020-08-21 – 2020-08-28 (×26): 500 mg via ORAL
  Filled 2020-08-21 (×25): qty 1

## 2020-08-21 MED ORDER — LIDOCAINE 5 % EX PTCH
1.0000 | MEDICATED_PATCH | CUTANEOUS | Status: DC
  Administered 2020-08-22 – 2020-08-27 (×6): 1 via TRANSDERMAL
  Filled 2020-08-21 (×4): qty 1

## 2020-08-21 MED ORDER — PANTOPRAZOLE SODIUM 40 MG IV SOLR
40.0000 mg | Freq: Every day | INTRAVENOUS | Status: DC
  Filled 2020-08-21: qty 40

## 2020-08-21 MED ORDER — ACETAMINOPHEN 325 MG PO TABS: 325.0000 mg | ORAL_TABLET | ORAL | Status: DC | PRN

## 2020-08-21 MED ORDER — TRAMADOL HCL 50 MG PO TABS: 50.0000 mg | ORAL_TABLET | Freq: Four times a day (QID) | ORAL | Status: DC | PRN

## 2020-08-21 MED ORDER — OXYCODONE HCL 5 MG PO TABS: 5.0000 mg | ORAL_TABLET | ORAL | Status: DC | PRN

## 2020-08-21 MED ORDER — FLEET ENEMA 7-19 GM/118ML RE ENEM: 1.0000 | ENEMA | Freq: Once | RECTAL | Status: DC | PRN
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
## 2020-08-21 NOTE — Progress Notes (Signed)
Inpatient Rehabilitation  Patient information reviewed and entered into eRehab system by Hulet Ehrmann M. Teofila Bowery, M.A., CCC/SLP, PPS Coordinator.  Information including medical coding, functional ability and quality indicators will be reviewed and updated through discharge.    
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
## 2020-08-21 NOTE — Progress Notes (Signed)
Patient arrived from [facility] around 1700. Patient appeared alert and was without complaint of pain.

## 2020-08-21 NOTE — Progress Notes (Signed)
Inpatient Rehab Admissions Coordinator:   I have a bed available for this patient to admit today and [place], PA-C, in agreement.  Will let pt/family and TOC know.  Estill Dooms, PT, DPT Admissions Coordinator [phone] 08/21/20  10:19 AM

## 2020-08-21 NOTE — Progress Notes (Signed)
Inpatient Rehabilitation Medication Review by a Pharmacist  A complete drug regimen review was completed for this patient to identify any potential clinically significant medication issues.  Clinically significant medication issues were identified:  no  Check AMION for pharmacist assigned to patient if future medication questions/issues arise during this admission.  Pharmacist comments:   Time spent performing this drug regimen review (minutes):  15   William Knapp 08/21/2020 9:15 PM

## 2020-08-21 NOTE — TOC Transition Note (Signed)
Transition of Care [facility]) - CM/SW Discharge Note   Patient Details  Name: William Knapp MRN: [id number] Date of Birth: Jun 01, 2000  Transition of Care [facility]) CM/SW Contact:  Glennon Mac, RN Phone Number: 08/21/2020, 1:47 PM   Clinical Narrative:  Pt medically stable for discharge, and has been accepted for admission to [facility] IP Rehab.  Plan dc to CIR when bed available.      Final next level of care: IP Rehab Facility Barriers to Discharge: Barriers Resolved   Patient Goals and CMS Choice Patient states their goals for this hospitalization and ongoing recovery are:: to go home CMS Medicare.gov Compare Post Acute Care list provided to:: Patient Choice offered to / list presented to : Patient  Discharge Placement                       Discharge Plan and Services   Discharge Planning Services: CM Consult Post Acute Care Choice: IP Rehab                               Social Determinants of Health (SDOH) Interventions     Readmission Risk Interventions No flowsheet data found.  Quintella Baton, RN, BSN  Trauma/Neuro ICU Case Manager [phone]

## 2020-08-22 LAB — CBC WITH DIFFERENTIAL/PLATELET
Abs Immature Granulocytes: 0.04 10*3/uL (ref 0.00–0.07)
Basophils Absolute: 0 10*3/uL (ref 0.0–0.1)
Basophils Relative: 0 %
Eosinophils Absolute: 0.2 10*3/uL (ref 0.0–0.5)
Eosinophils Relative: 3 %
HCT: 30.3 % — ABNORMAL LOW (ref 39.0–52.0)
Hemoglobin: 10.4 g/dL — ABNORMAL LOW (ref 13.0–17.0)
Immature Granulocytes: 1 %
Lymphocytes Relative: 23 %
Lymphs Abs: 1.7 10*3/uL (ref 0.7–4.0)
MCH: 28.7 pg (ref 26.0–34.0)
MCHC: 34.3 g/dL (ref 30.0–36.0)
MCV: 83.7 fL (ref 80.0–100.0)
Monocytes Absolute: 0.6 10*3/uL (ref 0.1–1.0)
Monocytes Relative: 8 %
Neutro Abs: 4.8 10*3/uL (ref 1.7–7.7)
Neutrophils Relative %: 65 %
Platelets: 192 10*3/uL (ref 150–400)
RBC: 3.62 MIL/uL — ABNORMAL LOW (ref 4.22–5.81)
RDW: 13.5 % (ref 11.5–15.5)
WBC: 7.2 10*3/uL (ref 4.0–10.5)
nRBC: 0 % (ref 0.0–0.2)

## 2020-08-22 LAB — COMPREHENSIVE METABOLIC PANEL
ALT: 60 U/L — ABNORMAL HIGH (ref 0–44)
AST: 42 U/L — ABNORMAL HIGH (ref 15–41)
Albumin: 3.8 g/dL (ref 3.5–5.0)
Alkaline Phosphatase: 76 U/L (ref 38–126)
Anion gap: 9 (ref 5–15)
BUN: 14 mg/dL (ref 6–20)
CO2: 25 mmol/L (ref 22–32)
Calcium: 9.5 mg/dL (ref 8.9–10.3)
Chloride: 105 mmol/L (ref 98–111)
Creatinine, Ser: 0.75 mg/dL (ref 0.61–1.24)
GFR, Estimated: >60 mL/min (ref >60)
Glucose, Bld: 107 mg/dL — ABNORMAL HIGH (ref 70–99)
Potassium: 4.6 mmol/L (ref 3.5–5.1)
Sodium: 139 mmol/L (ref 135–145)
Total Bilirubin: 1.4 mg/dL — ABNORMAL HIGH (ref 0.3–1.2)
Total Protein: 7.2 g/dL (ref 6.5–8.1)

## 2020-08-22 MED ORDER — OXYCODONE HCL 5 MG PO TABS: 5.0000 mg | ORAL_TABLET | ORAL | Status: DC | PRN

## 2020-08-22 NOTE — Plan of Care (Deleted)
  Problem: RH Balance Goal: LTG: Patient will maintain dynamic sitting balance (OT) Description: LTG:  Patient will maintain dynamic sitting balance with assistance during activities of daily living (OT) Flowsheets (Taken 08/22/2020 1003) LTG: Pt will maintain dynamic sitting balance during ADLs with: Independent Goal: LTG Patient will maintain dynamic standing with ADLs (OT) Description: LTG:  Patient will maintain dynamic standing balance with assist during activities of daily living (OT)  Flowsheets (Taken 08/22/2020 1003) LTG: Pt will maintain dynamic standing balance during ADLs with: Supervision/Verbal cueing   Problem: Sit to Stand Goal: LTG:  Patient will perform sit to stand in prep for activites of daily living with assistance level (OT) Description: LTG:  Patient will perform sit to stand in prep for activites of daily living with assistance level (OT) Flowsheets (Taken 08/22/2020 1003) LTG: PT will perform sit to stand in prep for activites of daily living with assistance level: Supervision/Verbal cueing   Problem: RH Bathing Goal: LTG Patient will bathe all body parts with assist levels (OT) Description: LTG: Patient will bathe all body parts with assist levels (OT) Flowsheets (Taken 08/22/2020 1003) LTG: Pt will perform bathing with assistance level/cueing: Supervision/Verbal cueing   Problem: RH Dressing Goal: LTG Patient will perform upper body dressing (OT) Description: LTG Patient will perform upper body dressing with assist, with/without cues (OT). Flowsheets (Taken 08/22/2020 1003) LTG: Pt will perform upper body dressing with assistance level of: Independent Goal: LTG Patient will perform lower body dressing w/assist (OT) Description: LTG: Patient will perform lower body dressing with assist, with/without cues in positioning using equipment (OT) Flowsheets (Taken 08/22/2020 1003) LTG: Pt will perform lower body dressing with assistance level of: Supervision/Verbal  cueing   Problem: RH Toileting Goal: LTG Patient will perform toileting task (3/3 steps) with assistance level (OT) Description: LTG: Patient will perform toileting task (3/3 steps) with assistance level (OT)  Flowsheets (Taken 08/22/2020 1003) LTG: Pt will perform toileting task (3/3 steps) with assistance level: Supervision/Verbal cueing   Problem: RH Toilet Transfers Goal: LTG Patient will perform toilet transfers w/assist (OT) Description: LTG: Patient will perform toilet transfers with assist, with/without cues using equipment (OT) Flowsheets (Taken 08/22/2020 1003) LTG: Pt will perform toilet transfers with assistance level of: Supervision/Verbal cueing
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
## 2020-08-22 NOTE — Plan of Care (Signed)
Problem: RH Balance Goal: LTG: Patient will maintain dynamic sitting balance (OT) Description: LTG:  Patient will maintain dynamic sitting balance with assistance during activities of daily living (OT) 08/22/2020 1200 by Claron Rosencrans, Merlene Laughter, OT Flowsheets (Taken 08/22/2020 1200) LTG: Pt will maintain dynamic sitting balance during ADLs with: Independent 08/22/2020 1003 by Mal Amabile, OT Flowsheets (Taken 08/22/2020 1003) LTG: Pt will maintain dynamic sitting balance during ADLs with: Independent Goal: LTG Patient will maintain dynamic standing with ADLs (OT) Description: LTG:  Patient will maintain dynamic standing balance with assist during activities of daily living (OT)  08/22/2020 1200 by Karysa Heft, Merlene Laughter, OT Flowsheets (Taken 08/22/2020 1200) LTG: Pt will maintain dynamic standing balance during ADLs with: Independent with assistive device 08/22/2020 1003 by Junell Cullifer, Merlene Laughter, OT Flowsheets (Taken 08/22/2020 1003) LTG: Pt will maintain dynamic standing balance during ADLs with: Supervision/Verbal cueing   Problem: Sit to Stand Goal: LTG:  Patient will perform sit to stand in prep for activites of daily living with assistance level (OT) Description: LTG:  Patient will perform sit to stand in prep for activites of daily living with assistance level (OT) 08/22/2020 1200 by Neema Barreira, Merlene Laughter, OT Flowsheets (Taken 08/22/2020 1200) LTG: PT will perform sit to stand in prep for activites of daily living with assistance level: Independent with assistive device 08/22/2020 1003 by Kylyn Mcdade, Merlene Laughter, OT Flowsheets (Taken 08/22/2020 1003) LTG: PT will perform sit to stand in prep for activites of daily living with assistance level: Supervision/Verbal cueing   Problem: RH Bathing Goal: LTG Patient will bathe all body parts with assist levels (OT) Description: LTG: Patient will bathe all body parts with assist levels (OT) 08/22/2020 1200 by Mal Amabile, OT Flowsheets (Taken 08/22/2020 1200) LTG: Pt  will perform bathing with assistance level/cueing: Independent with assistive device  08/22/2020 1003 by Abigail Marsiglia, Merlene Laughter, OT Flowsheets (Taken 08/22/2020 1003) LTG: Pt will perform bathing with assistance level/cueing: Supervision/Verbal cueing   Problem: RH Dressing Goal: LTG Patient will perform upper body dressing (OT) Description: LTG Patient will perform upper body dressing with assist, with/without cues (OT). 08/22/2020 1200 by Azana Kiesler, Merlene Laughter, OT Flowsheets (Taken 08/22/2020 1200) LTG: Pt will perform upper body dressing with assistance level of: Independent with assistive device 08/22/2020 1003 by Emrick Hensch, Merlene Laughter, OT Flowsheets (Taken 08/22/2020 1003) LTG: Pt will perform upper body dressing with assistance level of: Independent Goal: LTG Patient will perform lower body dressing w/assist (OT) Description: LTG: Patient will perform lower body dressing with assist, with/without cues in positioning using equipment (OT) 08/22/2020 1200 by Kiley Solimine, Merlene Laughter, OT Flowsheets (Taken 08/22/2020 1200) LTG: Pt will perform lower body dressing with assistance level of: Independent with assistive device 08/22/2020 1003 by Erricka Falkner, Merlene Laughter, OT Flowsheets (Taken 08/22/2020 1003) LTG: Pt will perform lower body dressing with assistance level of: Supervision/Verbal cueing   Problem: RH Toileting Goal: LTG Patient will perform toileting task (3/3 steps) with assistance level (OT) Description: LTG: Patient will perform toileting task (3/3 steps) with assistance level (OT)  08/22/2020 1200 by Khristen Cheyney, Merlene Laughter, OT Flowsheets (Taken 08/22/2020 1200) LTG: Pt will perform toileting task (3/3 steps) with assistance level: Independent with assistive device 08/22/2020 1003 by Trenna Kiely, Merlene Laughter, OT Flowsheets (Taken 08/22/2020 1003) LTG: Pt will perform toileting task (3/3 steps) with assistance level: Supervision/Verbal cueing   Problem: RH Toilet Transfers Goal: LTG Patient will perform toilet transfers w/assist  (OT) Description: LTG: Patient will perform toilet transfers with assist, with/without cues using equipment (OT) 08/22/2020 1200 by  Jewelene Mairena, Merlene Laughter, OT Flowsheets (Taken 08/22/2020 1200) LTG: Pt will perform toilet transfers with assistance level of: Independent with assistive device 08/22/2020 1003 by Lottie Siska, Merlene Laughter, OT Flowsheets (Taken 08/22/2020 1003) LTG: Pt will perform toilet transfers with assistance level of: Supervision/Verbal cueing   Problem: RH Tub/Shower Transfers Goal: LTG Patient will perform tub/shower transfers w/assist (OT) Description: LTG: Patient will perform tub/shower transfers with assist, with/without cues using equipment (OT) Flowsheets (Taken 08/22/2020 1201) LTG: Pt will perform tub/shower stall transfers with assistance level of: Supervision/Verbal cueing

## 2020-08-22 NOTE — Progress Notes (Signed)
Inpatient Rehabilitation Center Individual Statement of Services  Patient Name:  William Knapp  Date:  08/22/2020  Welcome to the Inpatient Rehabilitation Center.  Our goal is to provide you with an individualized program based on your diagnosis and situation, designed to meet your specific needs.  With this comprehensive rehabilitation program, you will be expected to participate in at least 3 hours of rehabilitation therapies Monday-Friday, with modified therapy programming on the weekends.  Your rehabilitation program will include the following services:  Physical Therapy (PT), Occupational Therapy (OT), Speech Therapy (ST), 24 hour per day rehabilitation nursing, Therapeutic Recreaction (TR), Neuropsychology, Care Coordinator, Rehabilitation Medicine, Nutrition Services, Pharmacy Services and Other  Weekly team conferences will be held on Tuesdays to discuss your progress.  Your Inpatient Rehabilitation Care Coordinator will talk with you frequently to get your input and to update you on team discussions.  Team conferences with you and your family in attendance may also be held.  Expected length of stay: 6-9 Days  Overall anticipated outcome: MOD I  Depending on your progress and recovery, your program may change. Your Inpatient Rehabilitation Care Coordinator will coordinate services and will keep you informed of any changes. Your Inpatient Rehabilitation Care Coordinator's name and contact numbers are listed  below.  The following services may also be recommended but are not provided by the Inpatient Rehabilitation Center:    Home Health Rehabiltiation Services  Outpatient Rehabilitation Services    Arrangements will be made to provide these services after discharge if needed.  Arrangements include referral to agencies that provide these services.  Your insurance has been verified to be:  UNINSURED  Your primary doctor is:  NO PCP  Pertinent information will be shared  with your doctor and your insurance company.  Inpatient Rehabilitation Care Coordinator:  Lavera Guise, [place] [phone] or [phone]  Information discussed with and copy given to patient by: Andria Rhein, 08/22/2020, 10:22 AM
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
## 2020-08-22 NOTE — Progress Notes (Signed)
Physical Therapy Session Note  Patient Details  Name: William Knapp MRN: [id number] Date of Birth: 04-06-2001  Today's Date: 08/22/2020 PT Individual Time: 3474-2595 PT Individual Time Calculation (min): 44 min   Short Term Goals: Week 1:  PT Short Term Goal 1 (Week 1): STG =LTG due to LOS  Skilled Therapeutic Interventions/Progress Updates:   Pt presents supine in bed and agreeable to therapy.  Pt supervised for scooting to EOB, using RLE to assist LLE.  Pt transfers sit to stand w/ CGA, maintaining TDWB LLE.  Pt amb in room to w/c.PT wheeled to gym, on and off elevator for time conservation.  Pt amb multiple trials w/ RW and CGA.  Pt requires verbal cues to improve gait cycle, especially foot clearance RLE.  Pt amb w/ step-to gait pattern.  Axillary crutches brought, but unable to perform 2/2 recent CT incision.  Pt attempted amb w/ [place] crutches, but unable 2/2 fatigue.  Pt performed 2 steps w/ 1 rail and 1 L[place]strand crutch backwards.  Pt required verbal cues for proper hand placement to successfully ascend and descend 2 steps. Lofstrands left in room for contined use.   Pt returned to hallway and again amb 50' w/ RW and CGA to bed.  Pt required CGA for sit to supine, verbal cues for breathing technique when performing lift to bed.  Bed alarm on and all needs in reach.  Ambulation/gait training;Discharge planning;Functional mobility training;Psychosocial support;Therapeutic Activities;Balance/vestibular training;Disease management/prevention;Neuromuscular re-education;Skin care/wound management;Therapeutic Exercise;Wheelchair propulsion/positioning;DME/adaptive equipment instruction;Pain management;Splinting/orthotics;UE/LE Strength taining/ROM;Community reintegration;Functional electrical stimulation;Patient/family education;Stair training;UE/LE Coordination activities   Therapy Documentation Precautions:  Precautions Precautions: Fall Restrictions Weight Bearing  Restrictions: Yes RLE Weight Bearing: Weight bearing as tolerated LLE Weight Bearing: Touchdown weight bearing General:   Vital Signs: Therapy Vitals Temp: 98.2 F (36.8 C) Temp Source: Oral Pulse Rate: 96 Resp: 16 BP: 133/69 Patient Position (if appropriate): Lying Oxygen Therapy SpO2: 100 % O2 Device: Room Air Pain:4/10   Mobility: Bed Mobility Bed Mobility: Rolling Right;Rolling Left;Supine to Sit Rolling Right: Supervision/verbal cueing Rolling Left: Supervision/Verbal cueing Supine to Sit: Supervision/Verbal cueing Sit to Supine: Minimal Assistance - Patient > 75% Transfers Transfers: Sit to Stand;Stand to Sit;Stand Pivot Transfers Sit to Stand: Contact Guard/Touching assist Stand to Sit: Contact Guard/Touching assist Stand Pivot Transfers: Contact Guard/Touching assist Transfer (Assistive device): Rolling walker Locomotion : Gait Ambulation: Yes Gait Assistance: Contact Guard/Touching assist Gait Distance (Feet): 60 Feet Assistive device: Rolling walker Gait Gait: Yes Gait Pattern: Impaired Gait Pattern: Step-to pattern;Decreased trunk rotation;Antalgic;Decreased stride length;Decreased step length - right;Poor foot clearance - right Gait velocity: decreased Stairs / Additional Locomotion Stairs: Yes Stairs Assistance: Contact Guard/Touching assist Stair Management Technique: Two rails Number of Stairs: 4 Height of Stairs: 6 Ramp: Contact Guard/touching assist (RW) Wheelchair Mobility Wheelchair Mobility: Yes Wheelchair Assistance: Doctor, general practice: Both upper extremities Wheelchair Parts Management: Needs assistance Distance: 164ft  Trunk/Postural Assessment : Cervical Assessment Cervical Assessment: Within Functional Limits Thoracic Assessment Thoracic Assessment: Within Functional Limits Lumbar Assessment Lumbar Assessment: Exceptions to [facility] (posterior pelvic tilt) Postural Control Postural Control: Deficits on  evaluation  Balance: Balance Balance Assessed: Yes Static Sitting Balance Static Sitting - Balance Support: Feet supported;Bilateral upper extremity supported Static Sitting - Level of Assistance: 7: Independent Dynamic Sitting Balance Dynamic Sitting - Balance Support: Feet supported;No upper extremity supported Dynamic Sitting - Level of Assistance: 5: Stand by assistance Static Standing Balance Static Standing - Balance Support: Bilateral upper extremity supported (RW) Static Standing - Level of Assistance: 5: Stand  by assistance (supervision) Dynamic Standing Balance Dynamic Standing - Balance Support: Bilateral upper extremity supported (RW) Dynamic Standing - Level of Assistance: 5: Stand by assistance (CGA) Exercises:   Other Treatments:      Therapy/Group: Individual Therapy  Lucio Edward 08/22/2020, 2:40 PM
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
## 2020-08-23 MED ORDER — OXYCODONE HCL 5 MG PO TABS: 5.0000 mg | ORAL_TABLET | Freq: Four times a day (QID) | ORAL | Status: DC | PRN

## 2020-08-23 NOTE — Plan of Care (Signed)
  Problem: Consults Goal: RH GENERAL PATIENT EDUCATION Description: See Patient Education module for education specifics. Outcome: Progressing   Problem: RH SAFETY Goal: RH STG ADHERE TO SAFETY PRECAUTIONS W/ASSISTANCE/DEVICE Description: STG Adhere to Safety Precautions With Mod I Assistance/Device. Outcome: Progressing Goal: RH STG DECREASED RISK OF FALL WITH ASSISTANCE Description: STG Decreased Risk of Fall With Mod I Assistance. Outcome: Progressing   Problem: RH PAIN MANAGEMENT Goal: RH STG PAIN MANAGED AT OR BELOW PT'S PAIN GOAL Description: <4 on a 0-10 Pain scale. Outcome: Progressing   Problem: RH KNOWLEDGE DEFICIT GENERAL Goal: RH STG INCREASE KNOWLEDGE OF SELF CARE AFTER HOSPITALIZATION Description: Patient will be able to demonstrate knowledge of medication management, pain management, skin/wound care, weight bearing precautions with educational materials and handouts provided by staff, at discharge independently. Outcome: Progressing   
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
## 2020-08-24 MED ORDER — OXYCODONE HCL 5 MG PO TABS: 5.0000 mg | ORAL_TABLET | Freq: Three times a day (TID) | ORAL | Status: DC | PRN

## 2020-08-24 NOTE — Progress Notes (Signed)
Physical Therapy Session Note  Patient Details  Name: William Knapp MRN: [id number] Date of Birth: [birth year]  Today's Date: 08/24/2020 PT Individual Time: 0805-0905 PT Individual Time Calculation (min): 60 min   Short Term Goals: Week 1:  PT Short Term Goal 1 (Week 1): STG =LTG due to LOS  Skilled Therapeutic Interventions/Progress Updates:     Patient in bed upon PT arrival. Patient alert and agreeable to PT session. Patient reported 2/10 L rib and pelvic pain during session, RN made aware. PT provided repositioning, rest breaks, and distraction as pain interventions throughout session.   Therapeutic Activity: Bed Mobility: Patient performed supine to/from sit with supervision with bed features and on a mat table. Patient recalled using his L leg to assist his R, provided cues for pushing up to his elbow to perform long sitting before bringing his legs off the bed/mat. Transfers: Patient performed sit to/from stand x3 and stand pivot x2 with supervision using RW. Provided verbal cues for hand placement, R hand pushing up L hand on RW to encourage R weight shift when standing.  Gait Training:  Patient ambulated 25 feet x2 using RW with supervision and 25 feet x1 using B forearm crutches with CGA. Ambulated with hop-to gait pattern on R maintaining TDWB or NWB on L with RW, but having more difficulty maintaining precautions with forearm crutches. Provided demonstration and verbal cues for technique, L NWB vs TDWB, and use of upper extremities for leverage for improved foot clearance. Adjusted RW height for improved upper extremity use.  Patient ascended/descended 2-6" steps using R crutch and L rail with CGA-min A. Performed hop-to gait pattern on R ascending forwards and descending backwards. Provided cues for technique and sequencing.   Wheelchair Mobility:  Obtained 16"x18" manual w/c for improved sitting tolerance and propulsion mechanics. Patient propelled wheelchair 55  feet, limited by rib pain with supervision-mod I. Provided verbal cues and demonstration for leg rest and break management throughout session.   Therapeutic Exercise: Patient performed the following exercises in supine with verbal and tactile cues for proper technique. -AAROM hip/knee flexion extension 2x5 with 2-4 sec hold at end range for improved ROM -hip abd/add 2x5 AAROM  Patient in bed at end of session with breaks locked, bed alarm set, and all needs within reach.    Therapy Documentation Precautions:  Precautions Precautions: Fall Restrictions Weight Bearing Restrictions: Yes RLE Weight Bearing: Weight bearing as tolerated LLE Weight Bearing: Touchdown weight bearing   Therapy/Group: Individual Therapy  Jaquasia Doscher L Idelia Caudell PT, DPT  08/24/2020, 12:42 PM

## 2020-08-24 NOTE — Plan of Care (Signed)
  Problem: Consults Goal: RH GENERAL PATIENT EDUCATION Description: See Patient Education module for education specifics. Outcome: Progressing   Problem: RH SAFETY Goal: RH STG ADHERE TO SAFETY PRECAUTIONS W/ASSISTANCE/DEVICE Description: STG Adhere to Safety Precautions With Mod I Assistance/Device. Outcome: Progressing Goal: RH STG DECREASED RISK OF FALL WITH ASSISTANCE Description: STG Decreased Risk of Fall With Mod I Assistance. Outcome: Progressing   Problem: RH PAIN MANAGEMENT Goal: RH STG PAIN MANAGED AT OR BELOW PT'S PAIN GOAL Description: <4 on a 0-10 Pain scale. Outcome: Progressing   Problem: RH KNOWLEDGE DEFICIT GENERAL Goal: RH STG INCREASE KNOWLEDGE OF SELF CARE AFTER HOSPITALIZATION Description: Patient will be able to demonstrate knowledge of medication management, pain management, skin/wound care, weight bearing precautions with educational materials and handouts provided by staff, at discharge independently. Outcome: Progressing
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
## 2020-08-25 LAB — COMPREHENSIVE METABOLIC PANEL
ALT: 30 U/L (ref 0–44)
AST: 19 U/L (ref 15–41)
Albumin: 3.5 g/dL (ref 3.5–5.0)
Alkaline Phosphatase: 86 U/L (ref 38–126)
Anion gap: 6 (ref 5–15)
BUN: 20 mg/dL (ref 6–20)
CO2: 28 mmol/L (ref 22–32)
Calcium: 9.3 mg/dL (ref 8.9–10.3)
Chloride: 102 mmol/L (ref 98–111)
Creatinine, Ser: 0.69 mg/dL (ref 0.61–1.24)
GFR, Estimated: >60 mL/min (ref >60)
Glucose, Bld: 102 mg/dL — ABNORMAL HIGH (ref 70–99)
Potassium: 4.1 mmol/L (ref 3.5–5.1)
Sodium: 136 mmol/L (ref 135–145)
Total Bilirubin: 1.1 mg/dL (ref 0.3–1.2)
Total Protein: 7.5 g/dL (ref 6.5–8.1)

## 2020-08-25 LAB — IRON AND TIBC
Iron: 39 ug/dL — ABNORMAL LOW (ref 45–182)
Saturation Ratios: 13 % — ABNORMAL LOW (ref 17.9–39.5)
TIBC: 304 ug/dL (ref 250–450)
UIBC: 265 ug/dL

## 2020-08-25 LAB — CBC
HCT: 30.5 % — ABNORMAL LOW (ref 39.0–52.0)
Hemoglobin: 10.1 g/dL — ABNORMAL LOW (ref 13.0–17.0)
MCH: 28.4 pg (ref 26.0–34.0)
MCHC: 33.1 g/dL (ref 30.0–36.0)
MCV: 85.7 fL (ref 80.0–100.0)
Platelets: 381 10*3/uL (ref 150–400)
RBC: 3.56 MIL/uL — ABNORMAL LOW (ref 4.22–5.81)
RDW: 14.1 % (ref 11.5–15.5)
WBC: 6 10*3/uL (ref 4.0–10.5)
nRBC: 0 % (ref 0.0–0.2)

## 2020-08-25 MED ORDER — FERROUS SULFATE 325 (65 FE) MG PO TABS
325.0000 mg | ORAL_TABLET | Freq: Every day | ORAL | Status: DC
  Administered 2020-08-26 – 2020-08-28 (×3): 325 mg via ORAL
  Filled 2020-08-25 (×3): qty 1

## 2020-08-25 MED ORDER — OXYCODONE HCL 5 MG PO TABS: 5.0000 mg | ORAL_TABLET | Freq: Two times a day (BID) | ORAL | Status: DC | PRN

## 2020-08-25 NOTE — Progress Notes (Signed)
Occupational Therapy Session Note  Patient Details  Name: William Knapp MRN: [id number] Date of Birth: 2000-08-06  Today's Date: 08/25/2020 OT Individual Time: 6195-0932 and 1402-1500 OT Individual Time Calculation (min): 45 min and 58 min   Short Term Goals: Week 1:  OT Short Term Goal 1 (Week 1): LTG=STG 2/2 ELOS  Skilled Therapeutic Interventions/Progress Updates:    1) Treatment session with focus on functional mobility and transfers in ADL apt.  Pt received semi-reclined in bed agreeable to therapy session.  Pt reports already dressed during previous therapy session. Pt completed stand pivot and ambulatory transfers with RW with supervision.  Therapist transported pt to ADL apt to engage in bathroom and various furniture transfers.  Pt reports plan to sleep on couch at home.  Completed transfers to/from couch and even into supine with supervision.  Pt required increased time and effort to stand from low couch with CGA.  Pt ambulated into bathroom and completed transfer from standard toilet with grab bar on L (pt has counter on L) with supervision.  Pt demonstrating good sequencing and awareness with sit > stand and TDWB throughout session.  Engaged in education on meal prep with focus on RW safety and how to transport items.  Pt will benefit from additional focus on RW mobility in home environment, especially in regards to meal prep/transporting food items.  Pt returned to room and transferred back to bed.  2) Treatment session with focus on functional mobility and dynamic standing balance.  Pt propelled w/c to Dayroom >500' for BUE strengthening and endurance.  Engaged in obstacle course with RW incorporating stepping over items and weaving through cones.  Pt initially required CGA when stepping over stick and cues for technique with RW fading to supervision.  During figure 8 around cones, pt initially taking a more straight and then 180* turn instead of curving around th cone.   Educated on maintaining TDWB when stepping over obstacle or threshold.  Engaged in dynamic standing balance and balance reactions while completing Jenga in standing.  Pt demonstrating good standing balance and tolerance.  Pt transported back to room and completed transfer back to bed supervision with RW.  Therapy Documentation Precautions:  Precautions Precautions: Fall Restrictions Weight Bearing Restrictions: Yes RLE Weight Bearing: Weight bearing as tolerated LLE Weight Bearing: Touchdown weight bearing General:   Vital Signs: Therapy Vitals Temp: 98 F (36.7 C) Pulse Rate: 92 Resp: 18 BP: 133/78 Patient Position (if appropriate): Lying Oxygen Therapy SpO2: 98 % O2 Device: Room Air Pain:  Pt with no c/o pain during AM or PM session.   Therapy/Group: Individual Therapy  Rosalio Loud 08/25/2020, 3:10 PM

## 2020-08-25 NOTE — Progress Notes (Signed)
Physical Therapy Session Note  Patient Details  Name: William Knapp MRN: [id number] Date of Birth: 07-Dec-2000  Today's Date: 08/25/2020 PT Individual Time: 6789-3810 PT Individual Time Calculation (min): 55 min   Short Term Goals: Week 1:  PT Short Term Goal 1 (Week 1): STG =LTG due to LOS  Skilled Therapeutic Interventions/Progress Updates:   Received pt supine in bed, pt agreeable to therapy, and denied any pain during session. Session with focus on dressing, functional mobility/transfers, generalized strengthening, dynamic standing balance/coordination, stair navigation, and improved activity tolerance. Pt requested to change clothes and transferred supine<>sitting EOB with supervision and use of bedrails using RLE to assist LLE across bed. Doffed dirty shirt and donned deodorant and clean shirt with supervision. Doffed pants sitting EOB with supervision via lateral leans and donned pants sitting EOB with supervision. Sit<>stand with RW and supervision to pull pants over hips and stand<>pivot bed<>WC with RW and close supervision. Pt demonstrating good awareness of LLE TDWB precautions throughout session. Pt reported that he now plans to sleep on the couch and won't be going up the flight of stairs to get to his bedroom. Pt also reported remembering that there is a side entrance into the house with 1 STE and 0 rails; therefore focused on stair navigation this session. Pt stood at sink and brushed teeth with supervision and transported to 4W therapy gym in [facility] total A for time management purposes. Pt navigated 1 6in curb x 4 trials with RW and CGA/close supervision. Pt ascended backwards and descended forwards with cues for RW safety with technique. Pt then performed WC mobility 143ft x 1 and 162ft x 1 using BUE and supervision back to room including getting on/off elevator. Pt able to set up transfer back to bed with supervision (including WC parts management and RW set up). Stand<>pivot  WC<>bed with RW and supervision and sit<>supine with supervision. Concluded session with pt supine in bed, needs within reach, and bed alarm on.   Therapy Documentation Precautions:  Precautions Precautions: Fall Restrictions Weight Bearing Restrictions: Yes RLE Weight Bearing: Weight bearing as tolerated LLE Weight Bearing: Touchdown weight bearing  Therapy/Group: Individual Therapy Martin Majestic PT, DPT   08/25/2020, 7:12 AM

## 2020-08-25 NOTE — Plan of Care (Signed)
  Problem: RH Stairs Goal: LTG Patient will ambulate up and down stairs w/assist (PT) Description: LTG: Patient will ambulate up and down # of stairs with assistance (PT) Flowsheets (Taken 08/25/2020 0900) LTG: Pt will ambulate up/down stairs assist needed:: (changed due to alternate entrance to house) Supervision/Verbal cueing LTG: Pt will  ambulate up and down number of stairs: 1 step 0 rails Note: changed due to alternate entrance to house

## 2020-08-25 NOTE — Plan of Care (Signed)
  Problem: Consults Goal: RH GENERAL PATIENT EDUCATION Description: See Patient Education module for education specifics. Outcome: Progressing   Problem: RH SAFETY Goal: RH STG ADHERE TO SAFETY PRECAUTIONS W/ASSISTANCE/DEVICE Description: STG Adhere to Safety Precautions With Mod I Assistance/Device. Outcome: Progressing Goal: RH STG DECREASED RISK OF FALL WITH ASSISTANCE Description: STG Decreased Risk of Fall With Mod I Assistance. Outcome: Progressing   Problem: RH PAIN MANAGEMENT Goal: RH STG PAIN MANAGED AT OR BELOW PT'S PAIN GOAL Description: <4 on a 0-10 Pain scale. Outcome: Progressing   Problem: RH KNOWLEDGE DEFICIT GENERAL Goal: RH STG INCREASE KNOWLEDGE OF SELF CARE AFTER HOSPITALIZATION Description: Patient will be able to demonstrate knowledge of medication management, pain management, skin/wound care, weight bearing precautions with educational materials and handouts provided by staff, at discharge independently. Outcome: Progressing   
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
## 2020-08-26 MED ORDER — OXYCODONE HCL 5 MG PO TABS: 5.0000 mg | ORAL_TABLET | Freq: Every day | ORAL | Status: DC | PRN

## 2020-08-26 NOTE — Progress Notes (Signed)
Patient ID: William Knapp, male   DOB: November 27, 2000, 20 y.o.   MRN: [id number] Team Conference Report to Patient/Family  Team Conference discussion was reviewed with the patient and caregiver, including goals, any changes in plan of care and target discharge date.  Patient and caregiver express understanding and are in agreement.  The patient has a target discharge date of 08/28/20.  SW met with pt. Called pt father, left VM. PT happy about d/c on Thursday. Provided pt with updates and informed him on being unable to reach father. Pt reports his step mother will pick him up at d/c.    Dyanne Iha 08/26/2020, 1:12 PM
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
## 2020-08-26 NOTE — Progress Notes (Signed)
Physical Therapy Session Note  Patient Details  Name: William Knapp MRN: [id number] Date of Birth: January 03, 2001  Today's Date: 08/26/2020 PT Individual Time: 0800-0840; 1000-1100 PT Individual Time Calculation (min): 40 min and 60 mins  Short Term Goals: Week 1:  PT Short Term Goal 1 (Week 1): STG =LTG due to LOS  Skilled Therapeutic Interventions/Progress Updates:    Session 1: Patient received sitting up in bed, agreeable to PT. He reports "some pain" in L LE, but does not rate, premedicated. PT providing rest breaks, distractions and repositioning to assist with pain management. He was able to come sit edge of bed with supervision hooking L LE on R LE to assist in LE management. Sit > stand > stand pivot to wc with RW and close supervision. Patient able to stand at sink with supervision to brush teeth. TotalA wc mob to [employer] for time management. Patient requesting to practice curb negotiation this session. 6" and 8" curb practiced with RW and light CGA/ close supervision provided. He ascends backwards and descends forwards "kickstanding" L LE down first to assist in balance. He reports d/cing to his dad + step moms house, who can provide at least supervision for this maneuver. He also requested to practice sit <> stand trialing ways that may reduce strain on low back. When he braces on front bracket of RW, instead of handpad, there is less strain to his back and he is able to complete this with supervision safely. Patient returning to room in wc, remaining up, call light within reach.    Session 2: Patient received reclined in bed, agreeable to PT. He denies pain. Patient able to come sit edge of bed ModI with compensatory strategies. Supervision stand pivot to wc with RW. PT transporting patient in wc to 4W gym for time management. PT providing patient with HEP and patient able to move through exercise program during session. No significant increase in pain with movements. He  continues to have difficulty with L LE adduction. HEP with the following exercises:  -SLR -quad sets -hip abduction  -hip adduction -heel slides -towel scrunches -sit <> stand  -standing L hip abd (stnading on R LE only) All exercises 3x10 to tolerance. Patient with multiple questions about scar management. PT reviewing scar healing phases and basic scar mobility once scars are healed. Patient with questions regarding if incisions need to be covered when bathing- PT deferring to RN and MD for this. Patient returning to room in wc, transferring to bed via stand pivot with RW and supervision. Bed alarm on, call light within reach.    Therapy Documentation Precautions:  Precautions Precautions: Fall Restrictions Weight Bearing Restrictions: No RLE Weight Bearing: Weight bearing as tolerated LLE Weight Bearing: Touchdown weight bearing    Therapy/Group: Individual Therapy  Elizebeth Koller, PT, DPT, CBIS  08/26/2020, 7:43 AM

## 2020-08-26 NOTE — Progress Notes (Signed)
Occupational Therapy Session Note  Patient Details  Name: William Knapp MRN: [id number] Date of Birth: 16-Feb-2001  Today's Date: 08/26/2020 OT Individual Time: 9163-8466 OT Individual Time Calculation (min): 70 min    Short Term Goals: Week 1:  OT Short Term Goal 1 (Week 1): LTG=STG 2/2 ELOS  Skilled Therapeutic Interventions/Progress Updates:    Treatment session with focus on self-care retraining at shower level.  Pt received semi-reclined in bed reporting desire to shower during therapy session.  Pt completed bed mobility and short distance ambulatory transfers with RW with distant supervision.  Pt completed tub/shower transfer via tub bench Mod I.  Pt completed all bathing with lateral leans and sit > stand Mod I, after setup to cover incisions.  Pt able to utilize AE for LB bathing and dressing with increased time but no cues or assistance from therapist.  Pt returned to room and transferred back to bed Mod I.  Pt left semi-reclined with all needs in reach.  Therapy Documentation Precautions:  Precautions Precautions: Fall Restrictions Weight Bearing Restrictions: No RLE Weight Bearing: Weight bearing as tolerated LLE Weight Bearing: Touchdown weight bearing General:   Vital Signs: Therapy Vitals Temp: 98.6 F (37 C) Temp Source: Oral Pulse Rate: 93 Resp: 19 BP: 135/78 Patient Position (if appropriate): Lying Oxygen Therapy SpO2: 98 % O2 Device: Room Air Pain:  Pt with no c/o pain   Therapy/Group: Individual Therapy  Rosalio Loud 08/26/2020, 3:19 PM

## 2020-08-27 MED ORDER — PANTOPRAZOLE SODIUM 40 MG PO TBEC
40.0000 mg | DELAYED_RELEASE_TABLET | Freq: Every day | ORAL | 0 refills | Status: DC
  Filled 2020-08-27: qty 30, 30d supply, fill #0

## 2020-08-27 MED ORDER — APIXABAN 2.5 MG PO TABS
2.5000 mg | ORAL_TABLET | Freq: Two times a day (BID) | ORAL | 0 refills | Status: AC
  Filled 2020-08-27: qty 60, 30d supply, fill #0

## 2020-08-27 MED ORDER — LIDOCAINE 5 % EX PTCH
1.0000 | MEDICATED_PATCH | CUTANEOUS | 0 refills | Status: AC
  Filled 2020-08-27: qty 30, 30d supply, fill #0

## 2020-08-27 MED ORDER — DOCUSATE SODIUM 100 MG PO CAPS
100.0000 mg | ORAL_CAPSULE | Freq: Two times a day (BID) | ORAL | 0 refills | Status: DC
  Filled 2020-08-27: qty 60, 30d supply, fill #0

## 2020-08-27 MED ORDER — FERROUS SULFATE 325 (65 FE) MG PO TABS
325.0000 mg | ORAL_TABLET | Freq: Every day | ORAL | 0 refills | Status: DC
  Filled 2020-08-27: qty 30, 30d supply, fill #0

## 2020-08-27 MED ORDER — ACETAMINOPHEN 325 MG PO TABS
650.0000 mg | ORAL_TABLET | Freq: Four times a day (QID) | ORAL | 0 refills | Status: AC
  Filled 2020-08-27: qty 100, 13d supply, fill #0

## 2020-08-27 MED ORDER — APIXABAN 2.5 MG PO TABS
2.5000 mg | ORAL_TABLET | Freq: Two times a day (BID) | ORAL | Status: DC
  Administered 2020-08-28: 2.5 mg via ORAL
  Filled 2020-08-27: qty 1

## 2020-08-27 MED ORDER — METHOCARBAMOL 500 MG PO TABS
500.0000 mg | ORAL_TABLET | Freq: Four times a day (QID) | ORAL | 0 refills | Status: DC
  Filled 2020-08-27: qty 120, 30d supply, fill #0

## 2020-08-27 MED ORDER — VITAMIN D3 25 MCG PO TABS
1000.0000 [IU] | ORAL_TABLET | Freq: Every day | ORAL | 0 refills | Status: AC
  Filled 2020-08-27: qty 30, 30d supply, fill #0

## 2020-08-27 MED ORDER — TRAZODONE HCL 50 MG PO TABS
25.0000 mg | ORAL_TABLET | Freq: Every evening | ORAL | 0 refills | Status: DC | PRN
  Filled 2020-08-27: qty 15, 15d supply, fill #0

## 2020-08-27 NOTE — Progress Notes (Signed)
Inpatient Rehabilitation Care Coordinator Discharge Note  The overall goal for the admission was met for:   Discharge location: Yes, home  Length of Stay: Yes,7 Days   Discharge activity level: Yes, Mod I level from w/c and RW level  Home/community participation: Yes  Services provided included: MD, RD, PT, OT, SLP, RN, CM, TR, Pharmacy, Neuropsych and SW  Financial Services: Private Insurance: Uninsured  Choices offered to/list presented to:n/a  Follow-up services arranged: Outpatient: [facility]  Comments (or additional information): PT Wheelchair, Rolling Walker, Producer, television/film/video   Patient/Family verbalized understanding of follow-up arrangements: Yes  Individual responsible for coordination of the follow-up plan: pt, [phone]  Confirmed correct DME delivered: Dyanne Iha 08/27/2020    Dyanne Iha

## 2020-08-27 NOTE — Progress Notes (Signed)
Occupational Therapy Session Note  Patient Details  Name: William Knapp MRN: [id number] Date of Birth: 12-23-2000  Today's Date: 08/27/2020 OT Individual Time: 2831-5176 OT Individual Time Calculation (min): 53 min    Short Term Goals: Week 1:  OT Short Term Goal 1 (Week 1): LTG=STG 2/2 ELOS  Skilled Therapeutic Interventions/Progress Updates:    Treatment session with focus on functional mobility, dynamic standing balance, and d/c planning.  Pt received supine in bed agreeable to therapy session.  Pt declined bathing/dressing having completed shower yesterday afternoon.  Discussed home setup, DME, and use of w/c in kitchen with simple meal prep to increase independence.  Pt completed all stand pivot and short distance ambulatory transfers Mod I with RW.  Pt propelled w/c to Dayroom for BUE strengthening and endurance.  Engaged in Wii sports standing with RW to challenge endurance and dynamic standing balance.  Pt completed task Mod I.  Pt reports enjoying Wii sports activity this session.  Pt reports ready for d/c and that his father and step mom will be here later today to take him to the ICU to see him grandmother before he d/c home tomorrow.  Therapy Documentation Precautions:  Precautions Precautions: Fall Restrictions Weight Bearing Restrictions: Yes RLE Weight Bearing: Weight bearing as tolerated LLE Weight Bearing: Touchdown weight bearing General:   Vital Signs: Therapy Vitals Temp: 98.2 F (36.8 C) Temp Source: Oral Pulse Rate: 84 Resp: 20 BP: 127/77 Patient Position (if appropriate): Lying Oxygen Therapy SpO2: 98 % O2 Device: Room Air Pain:  Pt with no c/o pain   Therapy/Group: Individual Therapy  Rosalio Loud 08/27/2020, 7:53 AM

## 2020-08-27 NOTE — Progress Notes (Signed)
Physical Therapy Session Note  Patient Details  Name: William Knapp MRN: [id number] Date of Birth: 16-Oct-2000  Today's Date: 08/27/2020 PT Missed Time: 60 Minutes Missed Time Reason: Other (Comment) (visiting grandmother in ICU)  Short Term Goals: Week 1:  PT Short Term Goal 1 (Week 1): STG =LTG due to LOS  Skilled Therapeutic Interventions/Progress Updates:    Patient received sitting up in bed, some family at bedside. RN, family and patient report that he is being taken to the ICU to visit his grandmother, who is not doing well. Due to this, patient unable to participate in PT session. Family transporting patient in wc to grandmothers bedside.   Therapy Documentation Precautions:  Precautions Precautions: Fall Restrictions Weight Bearing Restrictions: Yes RLE Weight Bearing: Weight bearing as tolerated LLE Weight Bearing: Touchdown weight bearing    Therapy/Group: Individual Therapy  Elizebeth Koller, PT, DPT, CBIS  08/27/2020, 7:44 AM

## 2020-08-27 NOTE — Progress Notes (Signed)
Physical Therapy Discharge Summary  Patient Details  Name: William Knapp MRN: [id number] Date of Birth: 09-16-2000  Today's Date: 08/27/2020 PT Individual Time: 5638-7564 PT Individual Time Calculation (min): 54 min   Patient has met 10 of 10 long term goals due to improved activity tolerance, improved balance, improved postural control, increased strength, increased range of motion, decreased pain, improved awareness and improved coordination. Patient to discharge at a wheelchair level Modified Independent. Patient's family did not attend family education training. However, pt to discharge home at a Mod I level overall and demonstrates good safety awareness and understanding of LLE TDWB precautions.   All goals met   Recommendation:  Patient will benefit from ongoing skilled PT services in outpatient setting once able to weight bear to continue to advance safe functional mobility, address ongoing impairments in transfers, generalized strengthening, dynamic standing balance/coordination, gait training, stair navigation, endurance, and to minimize fall risk.  Equipment: 16x18 manual WC, RW  Reasons for discharge: treatment goals met  Patient/family agrees with progress made and goals achieved: Yes  Today's Interventions: Received pt supine in bed, pt agreeable to therapy, and denied any pain during session. Session with focus on discharge planning, dressing, functional mobility/transfers, generalized strengthening, dynamic standing balance/coordination, ambulation, simulated car transfers, and improved activity tolerance. Orthopedic PA present during session. Donned scrub pants with set up assist and transferred supine<>sit mod I using compensatory strategies to move LLE. Doffed gown and donned clean scrub top with set up assist. Stand<>pivot bed<>WC mod I with RW and transported to 74M ortho gym in [facility] total A for time management purposes. Pt performed ambulatory simulated car  transfer with RW and supervision. Pt then ambulated 23ft on uneven surfaces (ramp) with RW and supervision. Attempted to pick up cone from floor with RW however due to balance deficits pt unable. Pt ambulated 33ft mod I with RW while maintaining LLE TDWB status and performed WC mobility 176ft x 1 and 168ft x 1 mod I using BUE back to room. Pt able to manage WC parts independently and transfer WC<>bed stand<>pivot mod I with RW. Sit<>supine mod I. Concluded session with pt supine in bed, needs within reach, and bed alarm on. Provided pt with fresh drink/snack.   PT Discharge Precautions/Restrictions Precautions Precautions: Fall Restrictions Weight Bearing Restrictions: Yes RLE Weight Bearing: Weight bearing as tolerated LLE Weight Bearing: Touchdown weight bearing Cognition Overall Cognitive Status: Within Functional Limits for tasks assessed Arousal/Alertness: Awake/alert Orientation Level: Oriented X4 Memory: Appears intact Awareness: Appears intact Problem Solving: Appears intact Safety/Judgment: Appears intact Sensation Sensation Light Touch: Appears Intact Proprioception: Appears Intact Coordination Gross Motor Movements are Fluid and Coordinated: No Fine Motor Movements are Fluid and Coordinated: Yes Coordination and Movement Description: mild uncoordination due to pain, LLE TDWB precautions, and LE weakness Finger Nose Finger Test: [facility] bilaterally Heel Shin Test: improved ROM on LLE Motor  Motor Motor: Abnormal postural alignment and control Motor - Skilled Clinical Observations: mild uncoordination due to LLE TDWB precautions, pain, and LE weakness  Mobility Bed Mobility Bed Mobility: Rolling Right;Rolling Left;Sit to Supine;Supine to Sit Rolling Right: Independent with assistive device Rolling Left: Independent with assistive device Supine to Sit: Independent with assistive device Sit to Supine: Independent with assistive device Transfers Transfers: Sit to Stand;Stand  to Sit;Stand Pivot Transfers Sit to Stand: Independent with assistive device Stand to Sit: Independent with assistive device Stand Pivot Transfers: Independent with assistive device Transfer (Assistive device): Rolling walker Locomotion  Gait Ambulation: Yes Gait Assistance: Independent with  assistive device Gait Distance (Feet): 50 Feet Assistive device: Rolling walker Gait Gait: Yes Gait Pattern: Impaired Gait Pattern: Step-to pattern;Decreased trunk rotation;Antalgic;Decreased stride length;Decreased step length - right;Poor foot clearance - right Gait velocity: decreased Stairs / Additional Locomotion Stairs: Yes Stairs Assistance: Supervision/Verbal cueing Stair Management Technique: With walker Number of Stairs: 1 Height of Stairs: 6 Ramp: Supervision/Verbal cueing (RW) Curb: Supervision/Verbal cueing (RW) Product manager Mobility: Yes Wheelchair Assistance: Independent with Camera operator: Both upper extremities Wheelchair Parts Management: Independent Distance: 163ft  Trunk/Postural Assessment  Cervical Assessment Cervical Assessment: Within Functional Limits Thoracic Assessment Thoracic Assessment: Within Functional Limits Lumbar Assessment Lumbar Assessment: Exceptions to [facility] (posterior pelvic tilt) Postural Control Postural Control: Deficits on evaluation  Balance Balance Balance Assessed: Yes Static Sitting Balance Static Sitting - Balance Support: Feet supported;No upper extremity supported Static Sitting - Level of Assistance: 7: Independent Dynamic Sitting Balance Dynamic Sitting - Balance Support: Feet supported;No upper extremity supported Dynamic Sitting - Level of Assistance: 6: Modified independent (Device/Increase time) Static Standing Balance Static Standing - Balance Support: Bilateral upper extremity supported (RW) Static Standing - Level of Assistance: 6: Modified independent (Device/Increase  time) Dynamic Standing Balance Dynamic Standing - Balance Support: Bilateral upper extremity supported (RW) Dynamic Standing - Level of Assistance: 6: Modified independent (Device/Increase time) Extremity Assessment  RLE Assessment RLE Assessment: Exceptions to [facility] General Strength Comments: grossly generalized to 4+/5 (except hip add 4-/5) LLE Assessment LLE Assessment: Exceptions to [facility] General Strength Comments: grossly generalized to 4-/5 (except hip adduction 3+/5)   Alfonse Alpers PT, DPT  08/27/2020, 7:38 AM

## 2020-08-27 NOTE — Progress Notes (Signed)
Patient ID: William Knapp, male   DOB: November 09, 2000, 20 y.o.   MRN: [id number]  Met with patient to discuss readiness for discharge on Thursday. He reported that he was ready but sad at the same time due to his grandmother's prognosis. He refers to her as "mommie," and stated that the whole family refers to her as this. This RN/CM let him know that I would be available to talk or just listen if he felt like he needed for someone to just be there for him. He appreciated the offer and I left him my card.  Dorthula Nettles, RN3, BSN, CBIS, [place], [facility], Inpatient Rehabilitation Office [phone] Cell [phone]
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
## 2020-08-27 NOTE — Progress Notes (Signed)
Patient ID: William Knapp, male   DOB: [birth year], 19 y.o.   MRN: [id number]  Wheelchair, Agricultural consultant, Advertising copywriter ordered through [place], through [employer].  [place], [place] [phone]
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
## 2020-08-27 NOTE — Progress Notes (Signed)
Patient ID: William Knapp, male   DOB: 04/04/2001, 19 y.o.   MRN: [id number]   Pt MATCH entred into system. Effective 4/21, term 4/28  Lavera Guise, [place] [phone]

## 2020-08-27 NOTE — Progress Notes (Signed)
Patient ID: William Knapp, male   DOB: 01/13/2001, 19 y.o.   MRN: [id number]   Pt hospital follow up scheduled with [facility] on June 6 at 2:30 PM with Dr. Jonah Blue.   [place], [place] [phone]
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
## 2020-08-28 ENCOUNTER — Other Ambulatory Visit (HOSPITAL_COMMUNITY): Payer: Self-pay

## 2020-08-28 MED ORDER — METHOCARBAMOL 500 MG PO TABS: 500.0000 mg | ORAL_TABLET | Freq: Three times a day (TID) | ORAL | 0 refills | Status: AC

## 2020-08-28 MED ORDER — METHOCARBAMOL 500 MG PO TABS: 500.0000 mg | ORAL_TABLET | Freq: Three times a day (TID) | ORAL | Status: DC
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
## 2020-08-28 NOTE — Discharge Instructions (Signed)
  Inpatient Rehab Discharge Instructions  William Knapp Discharge date and time: 08/28/20   Activities/Precautions/ Functional Status: Activity: no lifting, driving, or strenuous exercise till cleared by MD--limit to touch down weight on left leg Diet: regular diet Wound Care: keep wound clean and dry . Contact Dr. Jena Gauss if you develop any problems with your incision/wound--redness, swelling, increase in pain, drainage or if you develop fever or chills.    Functional status:  ___ No restrictions     ___ Walk up steps independently ___ 24/7 supervision/assistance   ___ Walk up steps with assistance _X__ Intermittent supervision/assistance  ___ Bathe/dress independently ___ Walk with walker     ___ Bathe/dress with assistance ___ Walk Independently    ___ Shower independently ___ Walk with assistance    _X__ Shower with set up assist  _X__ No alcohol     ___ Return to work/school ________  Special Instructions:   COMMUNITY REFERRALS UPON DISCHARGE:    Outpatient: PT                Agency: [facility] Phone: [phone]              Appointment Date/Time: TBD upon WB status   Medical Equipment/Items Ordered: Wheelchair, Agricultural consultant, Advertising copywriter                                                 Agency/Supplier: [employer]   My questions have been answered and I understand these instructions. I will adhere to these goals and the provided educational materials after my discharge from the hospital.  Patient/Caregiver Signature _______________________________ Date __________  Clinician Signature _______________________________________ Date __________  Please bring this form and your medication list with you to all your follow-up doctor's appointments.

## 2020-08-28 NOTE — Progress Notes (Signed)
Patient discharged to home, accompanied by his famly.

## 2020-09-01 ENCOUNTER — Other Ambulatory Visit (HOSPITAL_COMMUNITY): Payer: Self-pay

## 2020-09-01 ENCOUNTER — Telehealth (HOSPITAL_COMMUNITY): Payer: Self-pay | Admitting: Pharmacist

## 2020-09-01 NOTE — Telephone Encounter (Signed)
Transitions of Care Pharmacy  ° °Call attempted for a pharmacy transitions of care follow-up. HIPAA appropriate voicemail was left with call back information provided.  ° °Call attempt #1. Will follow-up in 2-3 days.  °  °

## 2020-09-10 ENCOUNTER — Telehealth (HOSPITAL_COMMUNITY): Payer: Self-pay
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
## 2020-09-11 ENCOUNTER — Other Ambulatory Visit: Payer: Self-pay

## 2020-09-11 ENCOUNTER — Encounter: Payer: Self-pay | Admitting: Physical Medicine and Rehabilitation

## 2020-09-11 ENCOUNTER — Encounter: Payer: Self-pay | Attending: Physical Medicine and Rehabilitation | Admitting: Physical Medicine and Rehabilitation

## 2020-09-11 VITALS — BP 116/73 | HR 79 | Temp 98.0°F | Ht 71.0 in | Wt 138.0 lb

## 2020-09-11 DIAGNOSIS — T1490XS Injury, unspecified, sequela: Secondary | ICD-10-CM | POA: Insufficient documentation

## 2020-09-11 DIAGNOSIS — T1490XA Injury, unspecified, initial encounter: Secondary | ICD-10-CM

## 2020-09-11 DIAGNOSIS — K449 Diaphragmatic hernia without obstruction or gangrene: Secondary | ICD-10-CM | POA: Insufficient documentation

## 2020-09-11 DIAGNOSIS — S32810S Multiple fractures of pelvis with stable disruption of pelvic ring, sequela: Secondary | ICD-10-CM | POA: Insufficient documentation

## 2020-09-11 DIAGNOSIS — R1031 Right lower quadrant pain: Secondary | ICD-10-CM | POA: Insufficient documentation

## 2020-09-11 DIAGNOSIS — S270XXS Traumatic pneumothorax, sequela: Secondary | ICD-10-CM | POA: Insufficient documentation
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
## 2020-09-11 NOTE — Patient Instructions (Signed)
[id number]  1) Ginger (especially studied for arthritis)- reduce leukotriene production to decrease inflammation 2) Blueberries- high in phytonutrients that decrease inflammation 3) Salmon- marine omega-3s reduce joint swelling and pain 4) Pumpkin seeds- reduce inflammation 5) dark chocolate- reduces inflammation 6) turmeric- reduces inflammation 7) tart cherries - reduce pain and stiffness 8) extra virgin olive oil - its compound olecanthal helps to block prostaglandins  9) chili peppers- can be eaten or applied topically via capsaicin 10) mint- helpful for headache, muscle aches, joint pain, and itching 11) garlic- reduces inflammation  Link to further information on diet for chronic pain: [email or web address]

## 2020-10-12 ENCOUNTER — Telehealth: Payer: Self-pay | Admitting: Internal Medicine

## 2020-10-12 NOTE — Telephone Encounter (Signed)
Called patient and LVM advising I was calling from [facility] in regards to his appointment for tomorrow. Apologized for short notice, unfortunately Dr. Laural Benes is not going to be in the office to complete the appointment in person but we have changed it to virtual and she will be giving him a call at his appt time. Advised to call [phone] with questions or concerns.

## 2020-10-13 ENCOUNTER — Other Ambulatory Visit: Payer: Self-pay

## 2020-10-13 ENCOUNTER — Ambulatory Visit: Payer: Self-pay | Attending: Internal Medicine | Admitting: Internal Medicine
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
## 2021-09-17 IMAGING — DX DG CHEST 1V PORT
1 series · 1 of 1 positions shown · non-contrast
Comparison: 08/19/2020

CLINICAL DATA: Left hemothorax

EXAM:
PORTABLE CHEST 1 VIEW

[chest]
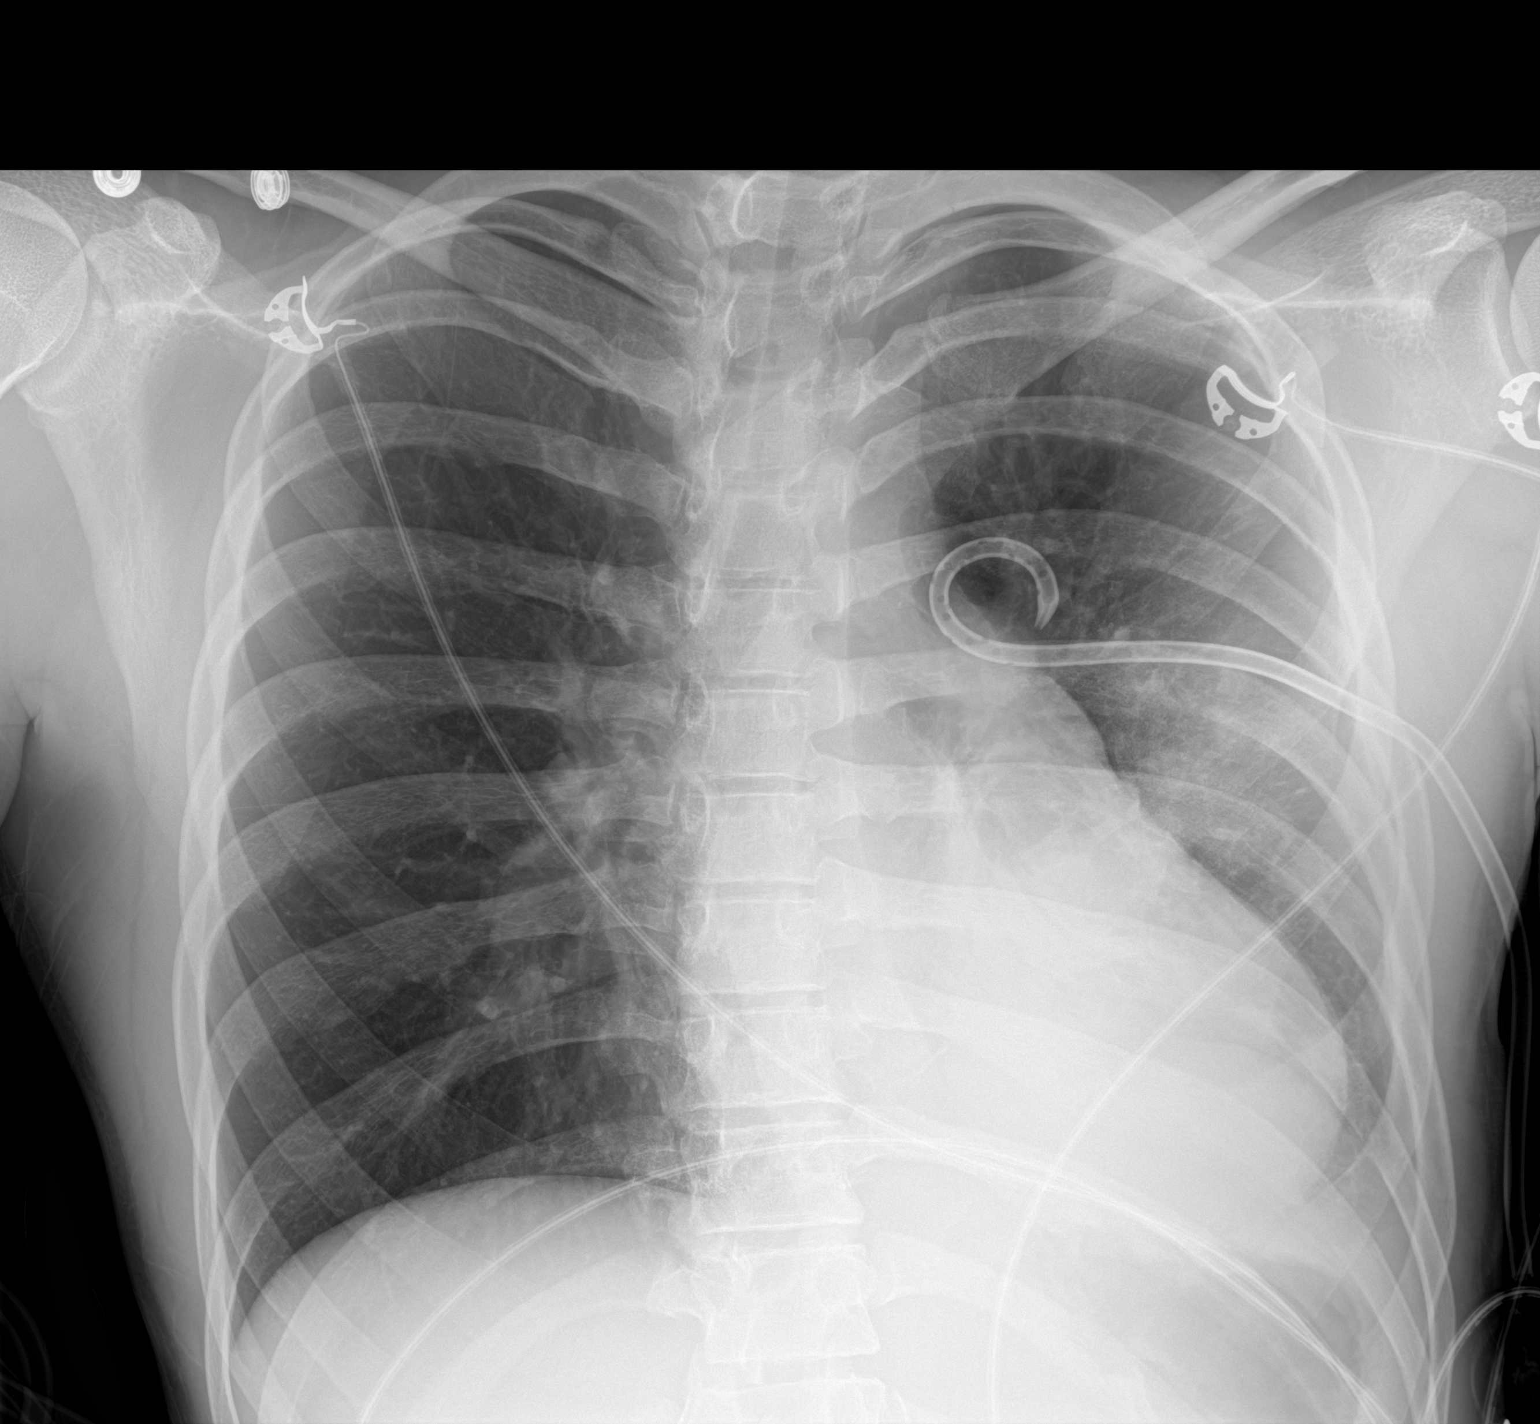

[1 of 1 positions shown; findings below may reference images not displayed]

FINDINGS: Left mid lung zone pigtail chest tube is unchanged. Focal
consolidation within the left mid lung zone has slightly improved in
the interval. Retrocardiac consolidation persists, however, this has
also improved. Right lung is clear. No pneumothorax or pleural
effusion. Cardiac size within normal limits.
IMPRESSION: Left chest tube in place.  No pneumothorax.  No pleural effusion.

Improving left mid and lower lung zone consolidation
